# Patient Record
Sex: Female | Born: 1955 | Race: Black or African American | Hispanic: No | Marital: Married | Smoking: Former smoker
Health system: Southern US, Community
[De-identification: ages and names within clinical notes are randomized; demographics above are authoritative.]

## PROBLEM LIST (undated history)

## (undated) DIAGNOSIS — D069 Carcinoma in situ of cervix, unspecified: Secondary | ICD-10-CM

## (undated) DIAGNOSIS — F419 Anxiety disorder, unspecified: Secondary | ICD-10-CM

## (undated) DIAGNOSIS — M1990 Unspecified osteoarthritis, unspecified site: Secondary | ICD-10-CM

## (undated) DIAGNOSIS — R87610 Atypical squamous cells of undetermined significance on cytologic smear of cervix (ASC-US): Secondary | ICD-10-CM

## (undated) HISTORY — PX: ABDOMINAL HYSTERECTOMY: SHX81

## (undated) HISTORY — PX: REDUCTION MAMMAPLASTY: SUR839

## (undated) HISTORY — PX: BUNIONECTOMY: SHX129

## (undated) HISTORY — DX: Carcinoma in situ of cervix, unspecified: D06.9

---
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
## 1998-04-20 ENCOUNTER — Other Ambulatory Visit: Admission: RE | Admit: 1998-04-20 | Discharge: 1998-04-20 | Payer: Self-pay | Admitting: Obstetrics and Gynecology

## 1999-06-07 ENCOUNTER — Other Ambulatory Visit: Admission: RE | Admit: 1999-06-07 | Discharge: 1999-06-07 | Payer: Self-pay | Admitting: Obstetrics and Gynecology

## 2000-08-07 ENCOUNTER — Other Ambulatory Visit: Admission: RE | Admit: 2000-08-07 | Discharge: 2000-08-07 | Payer: Self-pay | Admitting: Obstetrics and Gynecology

## 2001-10-01 ENCOUNTER — Other Ambulatory Visit: Admission: RE | Admit: 2001-10-01 | Discharge: 2001-10-01 | Payer: Self-pay | Admitting: Obstetrics and Gynecology

## 2002-07-10 ENCOUNTER — Encounter: Admission: RE | Admit: 2002-07-10 | Discharge: 2002-07-10 | Payer: Self-pay | Admitting: Obstetrics and Gynecology

## 2002-07-10 ENCOUNTER — Encounter: Payer: Self-pay | Admitting: Obstetrics and Gynecology

## 2002-08-11 ENCOUNTER — Other Ambulatory Visit: Admission: RE | Admit: 2002-08-11 | Discharge: 2002-08-11 | Payer: Self-pay | Admitting: Obstetrics and Gynecology

## 2003-07-13 ENCOUNTER — Encounter: Admission: RE | Admit: 2003-07-13 | Discharge: 2003-07-13 | Payer: Self-pay | Admitting: Obstetrics and Gynecology

## 2003-11-10 ENCOUNTER — Other Ambulatory Visit: Admission: RE | Admit: 2003-11-10 | Discharge: 2003-11-10 | Payer: Self-pay | Admitting: Obstetrics and Gynecology

## 2004-01-03 HISTORY — PX: CERVICAL CONE BIOPSY: SUR198

## 2004-01-14 ENCOUNTER — Encounter (INDEPENDENT_AMBULATORY_CARE_PROVIDER_SITE_OTHER): Payer: Self-pay | Admitting: Specialist

## 2004-01-14 ENCOUNTER — Ambulatory Visit (HOSPITAL_COMMUNITY): Admission: RE | Admit: 2004-01-14 | Discharge: 2004-01-14 | Payer: Self-pay | Admitting: Obstetrics and Gynecology

## 2004-01-14 ENCOUNTER — Ambulatory Visit (HOSPITAL_BASED_OUTPATIENT_CLINIC_OR_DEPARTMENT_OTHER): Admission: RE | Admit: 2004-01-14 | Discharge: 2004-01-14 | Payer: Self-pay | Admitting: Obstetrics and Gynecology

## 2004-05-16 ENCOUNTER — Other Ambulatory Visit: Admission: RE | Admit: 2004-05-16 | Discharge: 2004-05-16 | Payer: Self-pay | Admitting: Obstetrics and Gynecology

## 2004-07-15 ENCOUNTER — Encounter: Admission: RE | Admit: 2004-07-15 | Discharge: 2004-07-15 | Payer: Self-pay | Admitting: Obstetrics and Gynecology

## 2004-08-26 ENCOUNTER — Other Ambulatory Visit: Admission: RE | Admit: 2004-08-26 | Discharge: 2004-08-26 | Payer: Self-pay | Admitting: Obstetrics and Gynecology

## 2005-01-02 DIAGNOSIS — D069 Carcinoma in situ of cervix, unspecified: Secondary | ICD-10-CM

## 2005-01-02 HISTORY — DX: Carcinoma in situ of cervix, unspecified: D06.9

## 2005-01-20 ENCOUNTER — Other Ambulatory Visit: Admission: RE | Admit: 2005-01-20 | Discharge: 2005-01-20 | Payer: Self-pay | Admitting: Obstetrics and Gynecology

## 2005-03-27 ENCOUNTER — Ambulatory Visit (HOSPITAL_COMMUNITY): Admission: RE | Admit: 2005-03-27 | Discharge: 2005-03-27 | Payer: Self-pay | Admitting: Family Medicine

## 2005-04-19 ENCOUNTER — Ambulatory Visit: Payer: Self-pay | Admitting: Internal Medicine

## 2005-04-19 LAB — CONVERTED CEMR LAB
Basophils Absolute: 0 10*3/uL
Basophils Relative: 0 %
Eosinophils Absolute: 0.1 10*3/uL
HCT: 36.7 %
Hemoglobin: 11.8 g/dL
Lymphocytes Relative: 24 %
Lymphs Abs: 1.7 10*3/uL
MCV: 91.8 fL
Neutro Abs: 5 10*3/uL
Sed Rate: 28 mm/hr
WBC: 7.3 10*3/uL

## 2005-04-20 ENCOUNTER — Ambulatory Visit (HOSPITAL_COMMUNITY): Admission: RE | Admit: 2005-04-20 | Discharge: 2005-04-20 | Payer: Self-pay | Admitting: Internal Medicine

## 2005-04-21 ENCOUNTER — Ambulatory Visit: Payer: Self-pay | Admitting: Internal Medicine

## 2005-04-21 ENCOUNTER — Encounter (INDEPENDENT_AMBULATORY_CARE_PROVIDER_SITE_OTHER): Payer: Self-pay | Admitting: Specialist

## 2005-05-12 ENCOUNTER — Ambulatory Visit: Payer: Self-pay | Admitting: Internal Medicine

## 2005-06-02 ENCOUNTER — Ambulatory Visit: Payer: Self-pay | Admitting: Internal Medicine

## 2005-06-02 ENCOUNTER — Ambulatory Visit (HOSPITAL_COMMUNITY): Admission: RE | Admit: 2005-06-02 | Discharge: 2005-06-02 | Payer: Self-pay | Admitting: Internal Medicine

## 2005-06-12 ENCOUNTER — Inpatient Hospital Stay (HOSPITAL_COMMUNITY): Admission: RE | Admit: 2005-06-12 | Discharge: 2005-06-14 | Payer: Self-pay | Admitting: Obstetrics and Gynecology

## 2005-06-12 ENCOUNTER — Encounter (INDEPENDENT_AMBULATORY_CARE_PROVIDER_SITE_OTHER): Payer: Self-pay | Admitting: *Deleted

## 2005-06-23 ENCOUNTER — Ambulatory Visit: Payer: Self-pay | Admitting: Internal Medicine

## 2005-07-27 ENCOUNTER — Encounter: Admission: RE | Admit: 2005-07-27 | Discharge: 2005-07-27 | Payer: Self-pay | Admitting: Obstetrics and Gynecology

## 2006-02-02 LAB — CONVERTED CEMR LAB: Pap Smear: NORMAL

## 2006-02-07 ENCOUNTER — Telehealth (INDEPENDENT_AMBULATORY_CARE_PROVIDER_SITE_OTHER): Payer: Self-pay | Admitting: Internal Medicine

## 2006-02-08 ENCOUNTER — Other Ambulatory Visit: Admission: RE | Admit: 2006-02-08 | Discharge: 2006-02-08 | Payer: Self-pay | Admitting: Obstetrics and Gynecology

## 2006-08-03 ENCOUNTER — Encounter: Admission: RE | Admit: 2006-08-03 | Discharge: 2006-08-03 | Payer: Self-pay | Admitting: Obstetrics and Gynecology

## 2006-08-28 ENCOUNTER — Encounter (INDEPENDENT_AMBULATORY_CARE_PROVIDER_SITE_OTHER): Payer: Self-pay | Admitting: Internal Medicine

## 2006-08-28 DIAGNOSIS — D649 Anemia, unspecified: Secondary | ICD-10-CM

## 2006-08-29 ENCOUNTER — Ambulatory Visit: Payer: Self-pay | Admitting: Internal Medicine

## 2006-08-29 DIAGNOSIS — J309 Allergic rhinitis, unspecified: Secondary | ICD-10-CM | POA: Insufficient documentation

## 2006-08-29 DIAGNOSIS — H109 Unspecified conjunctivitis: Secondary | ICD-10-CM | POA: Insufficient documentation

## 2007-03-14 ENCOUNTER — Other Ambulatory Visit: Admission: RE | Admit: 2007-03-14 | Discharge: 2007-03-14 | Payer: Self-pay | Admitting: Obstetrics and Gynecology

## 2007-04-14 IMAGING — US US EXTREM LOW VENOUS*R*
1 series · 14 of 22 positions shown · non-contrast
Comparison: none

CLINICAL DATA: Right lower extremity swelling.
 RIGHT LOWER EXTREMITY VENOUS DOPPLER ULTRASOUND:
TECHNIQUE: Gray-scale sonography with compression, as well as color and duplex Doppler ultrasound, were performed to evaluate the deep venous system from the level of the common femoral vein through the popliteal and proximal calf veins.

[Series 1: unknown · 14 of 22 slices shown]
[im 1/22]
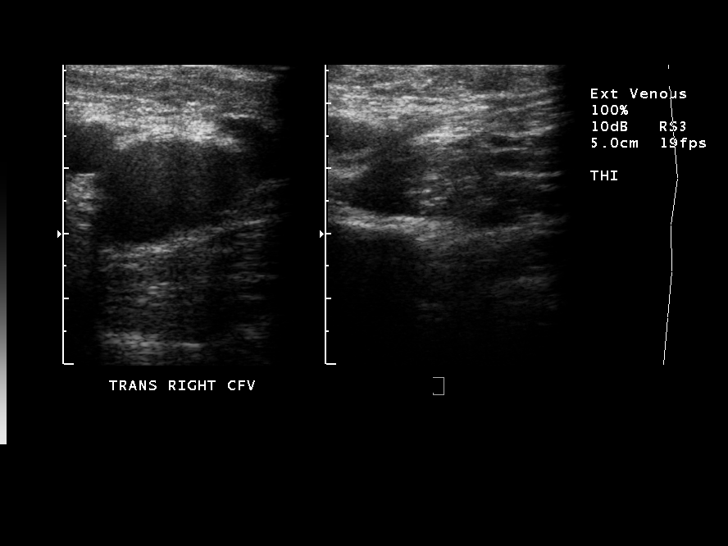
[im 3/22]
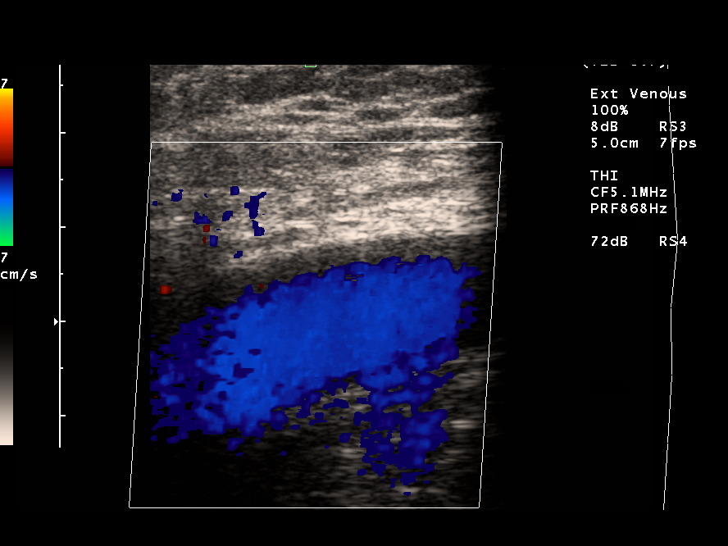
[im 4/22]
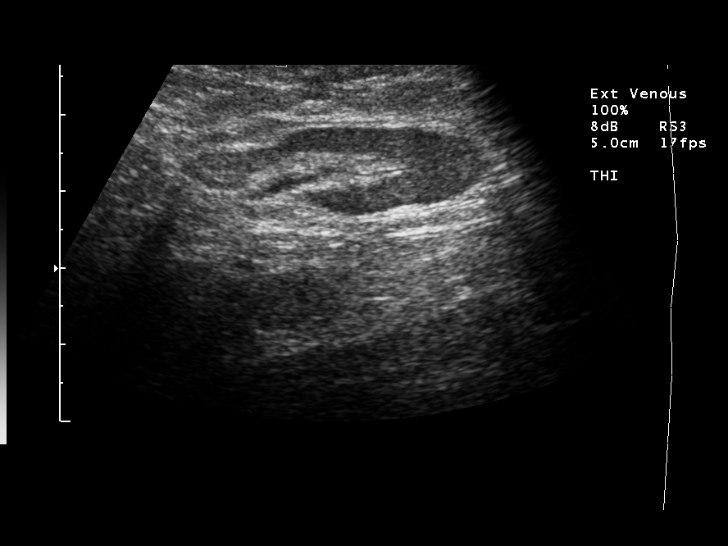
[im 6/22]
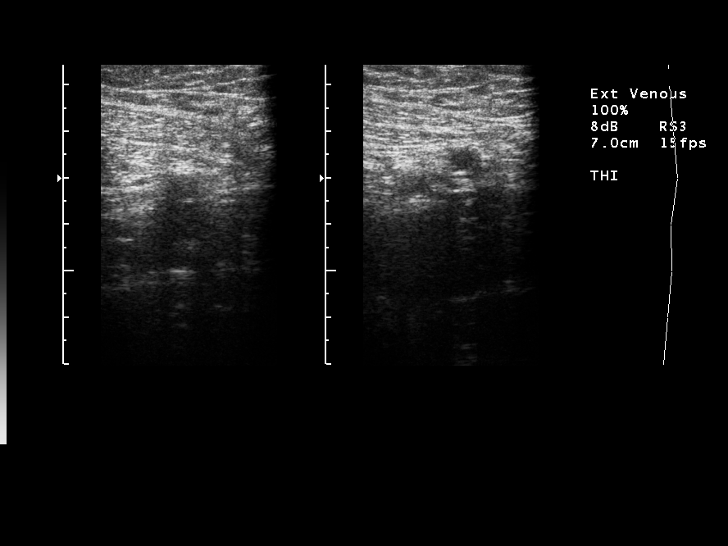
[im 8/22]
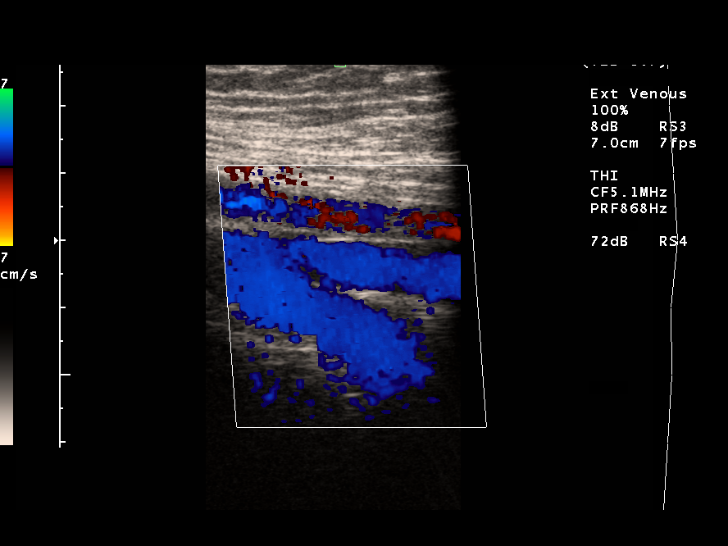
[im 9/22]
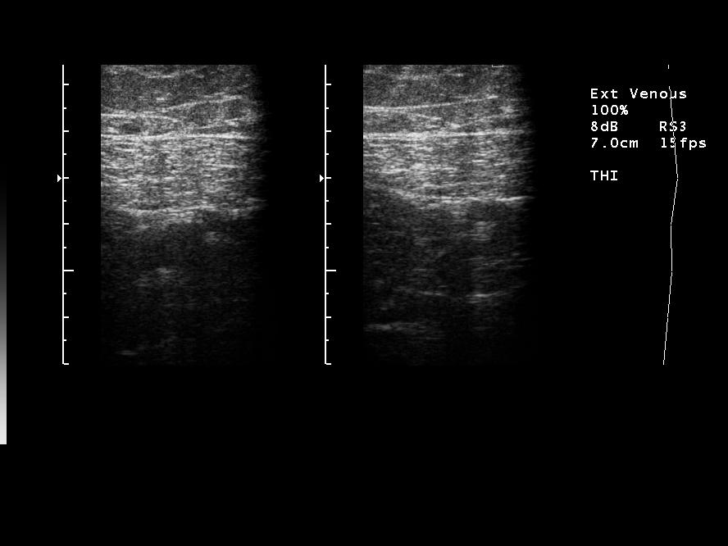
[im 11/22]
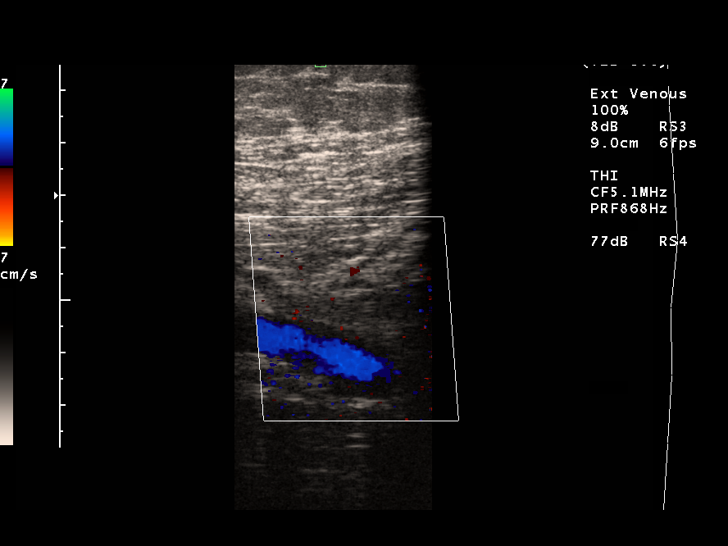
[im 12/22]
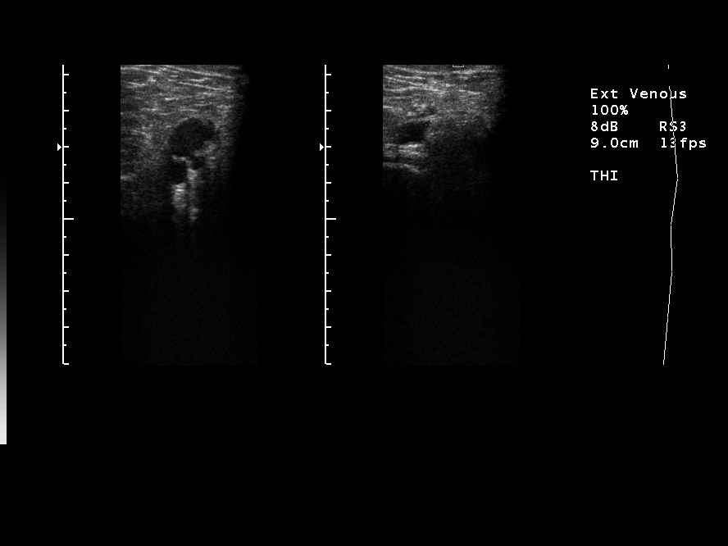
[im 14/22]
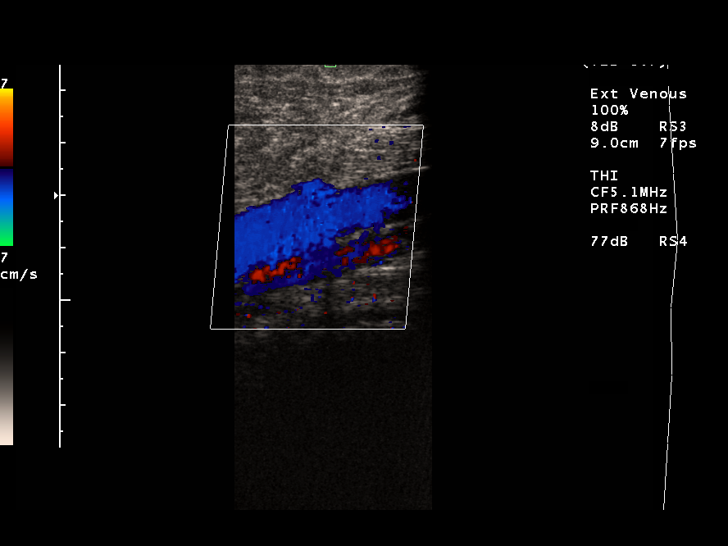
[im 15/22]
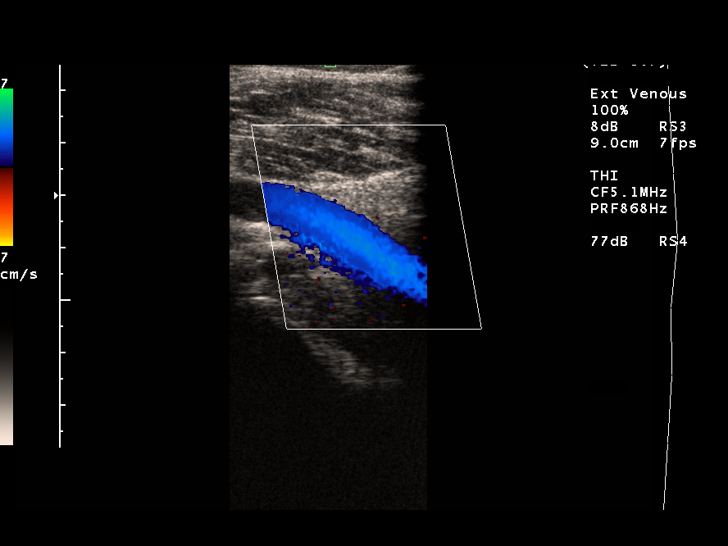
[im 17/22]
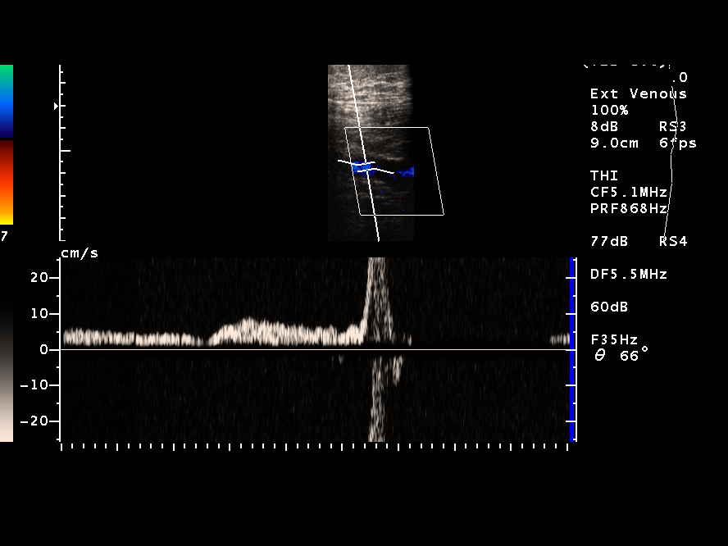
[im 19/22]
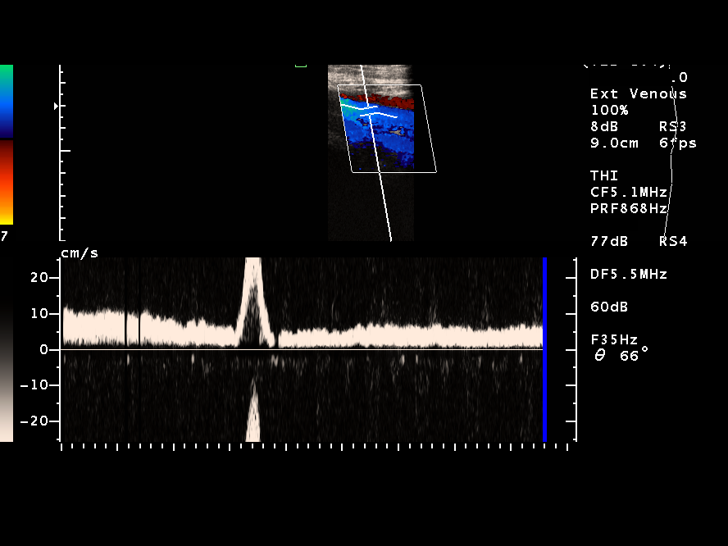
[im 20/22]
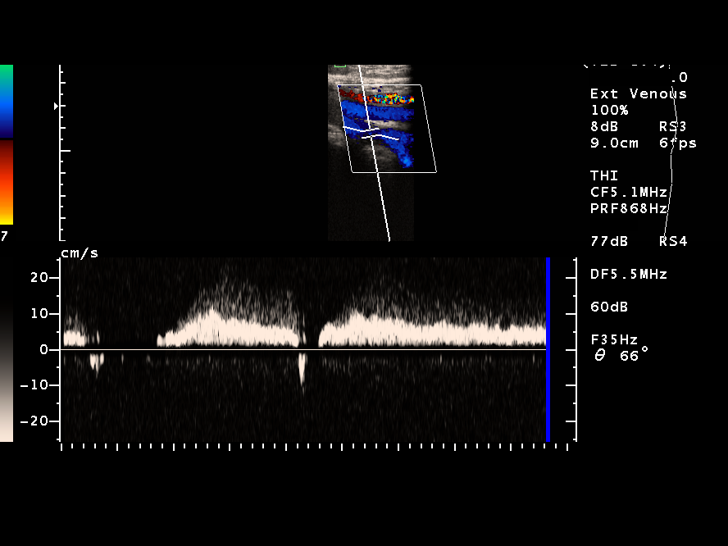
[im 22/22]
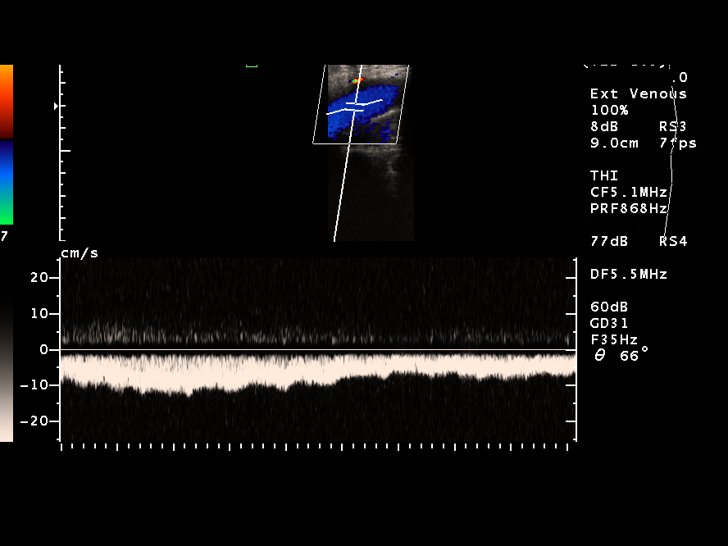

[14 of 22 positions shown; findings below may reference images not displayed]

FINDINGS: Complete compressibility is demonstrated throughout the visualized deep veins.  No venous filling defects are identified by gray-scale or color Doppler sonography.  Doppler waveforms show normal direction of venous flow, with normal phasicity and response to augmentation.
IMPRESSION: Negative.  No evidence of deep venous thrombosis.

## 2007-08-05 ENCOUNTER — Encounter: Admission: RE | Admit: 2007-08-05 | Discharge: 2007-08-05 | Payer: Self-pay | Admitting: Obstetrics and Gynecology

## 2008-03-26 ENCOUNTER — Other Ambulatory Visit: Admission: RE | Admit: 2008-03-26 | Discharge: 2008-03-26 | Payer: Self-pay | Admitting: Obstetrics and Gynecology

## 2008-03-26 ENCOUNTER — Ambulatory Visit: Payer: Self-pay | Admitting: Obstetrics and Gynecology

## 2008-03-26 ENCOUNTER — Encounter: Payer: Self-pay | Admitting: Obstetrics and Gynecology

## 2008-08-05 ENCOUNTER — Encounter: Admission: RE | Admit: 2008-08-05 | Discharge: 2008-08-05 | Payer: Self-pay | Admitting: Obstetrics and Gynecology

## 2009-04-30 ENCOUNTER — Ambulatory Visit: Payer: Self-pay | Admitting: Obstetrics and Gynecology

## 2009-04-30 ENCOUNTER — Other Ambulatory Visit: Admission: RE | Admit: 2009-04-30 | Discharge: 2009-04-30 | Payer: Self-pay | Admitting: Obstetrics and Gynecology

## 2009-07-21 ENCOUNTER — Ambulatory Visit: Payer: Self-pay | Admitting: Obstetrics and Gynecology

## 2009-08-06 ENCOUNTER — Encounter: Admission: RE | Admit: 2009-08-06 | Discharge: 2009-08-06 | Payer: Self-pay | Admitting: Obstetrics and Gynecology

## 2010-05-20 NOTE — Discharge Summary (Signed)
NAMEMANMEET, ARZOLA                ACCOUNT NO.:  [id number]   MEDICAL RECORD NO.:  [id number]          PATIENT TYPE:  INP   LOCATION:  9303                          FACILITY:  [facility]   PHYSICIAN:  Daniel L. 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 On the day of admission, she was taken to the operating  room and a total abdominal hysterectomy was performed. Postoperatively she  continued to progress and by the second postoperative day she was ready for  discharge.   DISCHARGE MEDICATIONS:  Tylox for pain relief, ferrous sulfate for iron  replacement and Dulcolax p.r.n. gas.   She will come back to the office next Monday for staple removal.   The final pathology report is not available at time of dictation.   DISCHARGE DIAGNOSES:  1.  Leiomyomata uteri, symptomatic.  2.  Carcinoma in situ of the cervix.   CONDITION ON DISCHARGE:  Improved.   OPERATION:  Total abdominal hysterectomy.      Daniel L. Eda Paschal, M.D.  Electronically Signed     DLG/MEDQ  D:  06/14/2005  T:  06/14/2005  Job:  [id number]

## 2010-05-20 NOTE — Op Note (Signed)
Tina [name], Tina [name]                ACCOUNT NO.:  [id number]   MEDICAL RECORD NO.:  [id number]          PATIENT TYPE:  INP   LOCATION:  9399                          FACILITY:  [facility]   PHYSICIAN:  Daniel L. Gottsegen, M.D.DATE OF BIRTH:  1955-04-22   DATE OF PROCEDURE:  06/12/2005  DATE OF DISCHARGE:                                 OPERATIVE REPORT   PREOPERATIVE DIAGNOSIS:  Large fibroids with symptoms carcinoma in situ of  the endocervix.   OPERATIONS:  Total abdominal hysterectomy.   SURGEON:  Dr. Eda Paschal   FIRST ASSISTANT:  Dr. Lily Peer.   FINDINGS AT SURGERY:  The patient had an extremely large fibroid uterus that  extended all the way to the umbilicus.  Total weight was 1300 grams when it  was removed.  Ovaries, fallopian tubes and pelvic peritoneum were free of  any disease.   PROCEDURE:  After adequate general endotracheal anesthesia, the patient was  placed in supine position, prepped and draped in the usual sterile manner.  A Foley catheter was inserted into her bladder.  A midline vertical incision  was made from the umbilicus to the suprapubic area.  It was extended down  through the fascia and then to the peritoneum which was entered by sharp  dissection.  There was fairly brisk bleeding opening the layers and this was  controlled with both figure-of-eights with 0 chromic as well as the Bovie.  When the peritoneal cavity was opened the above findings were noted.  The  uterus could be delivered.  The utero-ovarian round ligaments and fallopian  tubes were clamped, cut and doubly suture ligated with #1 chromic catgut.  The vesicouterine fold of peritoneum and posterior peritoneum was taken down  by sharp dissection.  The uterine arteries were clamped, cut and doubly  suture ligated.  The top of the fundus was then amputated and sent to  pathology for tissue diagnosis and then the cervix was removed in the  following fashion.  The parametria was taken down with  successive bites by  clamping, cutting and suture ligating with #1 chromic catgut.  The  cervicovaginal junction was identified with sharp dissection and the cervix  was sent to pathology for tissue diagnosis.  Angle sutures were placed in  the angles of the vagina incorporating the uterosacral and cardinal  ligaments for good vault support and then the cuff was closed with figure-of-  eights of #1 chromic catgut.  Copious irrigation was done with Ringer's  lactate.  Two sponge, needle and instrument counts were correct.  The  peritoneum and the fascia was closed in a single layer a double strand 0 PDS  one was started at the lower part of the incision, one was started at the  top and they met in the midline and were tied in place.  Copious  subcutaneous tissue was irrigated.  Copious irrigation was used to irrigate  the subcutaneous tissue.  A solution of dexamethasone was injected on the  skin edges to try to reduce the risk of keloid and then the skin was closed  with staples.  Estimated blood  loss for the entire procedure was 500 mL with  none replaced.  The patient tolerated the procedure well and left the  operating room in satisfactory condition.     Daniel L. Eda Paschal, M.D.  Electronically Signed    DLG/MEDQ  D:  06/12/2005  T:  06/12/2005  Job:  [id number]

## 2010-05-20 NOTE — H&P (Signed)
NAME:  Tina [name] NO.:  [id number]   MEDICAL RECORD NO.:  [id number]          PATIENT TYPE:  INP   LOCATION:                                FACILITY:  [facility]   PHYSICIAN:  Daniel L. Gottsegen, M.D.DATE OF BIRTH:  1955/10/21   DATE OF ADMISSION:  06/12/2005  DATE OF DISCHARGE:                                HISTORY & PHYSICAL   CHIEF COMPLAINT:  Symptomatic fibroids.   HISTORY OF PRESENT ILLNESS:  The patient is a 55 year old, gravida 1, para  0, AB 1 who has been watched now with gradually enlarging fibroids. Her  uterus now is the size of someone who is [redacted] weeks pregnant. You cannot feel  her ovaries on exam so it is difficult to tell whether she has ovarian  disease. When we ultrasound her, there is no way to image the right ovary as  a result of these large fibroids. The left ovary can be seen and is normal.  On ultrasound, she has multiple very large fibroids that are anywhere from 6  to 5 cm. The patient has severe menorrhagia, it is somewhat controlled with  oral contraceptives but it still is a problem. She is having frequent  urination from pressure on her bladder. She also is having pressure on her  rectum as well. She has also developed edema and stasis in her legs and her  internist feels that some of this is due to pressure on the large veins as a  result of these fibroids. What has made the entire picture even more  difficult in terms of continuing to manage her medically is that she has had  carcinoma in situ of the cervix, her canal was involved. Conization was done  and it is now almost impossible to get good Pap smears high in the cervix  because of the large size of the fibroids and inability to really visualize  her cervix well or negotiate her cervical canal well in order to do Pap  smears. As a result of all the above, she now enters the hospital for total  abdominal hysterectomy. We are going to conserve her ovaries assuming that  they are  normal because of her age. She appreciates the issues of ovarian  cancer down the road but feels that the benefits outweigh the risks as do I.   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   CURRENT MEDICATIONS:  Iron and oral contraceptives.   ALLERGIES:  She is allergic to no drugs.   FAMILY HISTORY:  Father has had coronary artery disease, paternal aunt has  had breast cancer. Mother had stomach cancer. She had a paternal aunt with  colon cancer. Her father is also hypertensive.   SOCIAL HISTORY:  Noncontributory.   PHYSICAL EXAMINATION:  GENERAL:  The patient is a well-developed, well-  nourished, female in no acute distress.  VITAL SIGNS:  Her blood pressure is 136/84, her pulse is 80 and  regular. Her  respirations are 16 and nonlabored. She is afebrile.  HEENT:  All within normal limits.  NECK:  Supple. Trachea in the midline. Thyroid is not enlarged.  LUNGS:  Clear to P&A.  HEART:  No thrills, heaves or murmurs.  BREASTS:  No masses.  ABDOMEN:  Soft without guarding, rebound or masses other than the fundus  which is palpable almost to the umbilicus.  PELVIC:  External is normal. BUS is normal, vaginal is normal. Cervix is  difficult to visualize but grossly appears normal. Last Pap smear was normal  but endocervical cells were not obtained. The uterus is enlarged to 18 week  size by fibroids. Adnexa cannot be palpated because of the fibroids.  Rectovaginal is confirmatory.  EXTREMITIES:  Reveal venous stasis with edema especially on the right side.   IMPRESSION:  Symptomatic fibroids, carcinoma in situ of the cervix.   PLAN:  Total abdominal hysterectomy.      Daniel L. Eda Paschal, M.D.  Electronically Signed     DLG/MEDQ  D:  06/10/2005  T:  06/10/2005  Job:  [id number]

## 2010-05-20 NOTE — Op Note (Signed)
[name], [name]                ACCOUNT NO.:  [id number]   MEDICAL RECORD NO.:  [id number]          PATIENT TYPE:  AMB   LOCATION:  [facility]                         FACILITY:  [facility]   PHYSICIAN:  Daniel L. Gottsegen, M.D.DATE OF BIRTH:  1955/03/06   DATE OF PROCEDURE:  01/14/2004  DATE OF DISCHARGE:                                 OPERATIVE REPORT   PREOPERATIVE DIAGNOSES:  1.  CIN-III of the endocervical canal.  2.  Dysfunctional uterine bleeding.   POSTOPERATIVE DIAGNOSES:  1.  CIN-III of the endocervical canal.  2.  Dysfunctional uterine bleeding.   OPERATION:  Conization of the cervix. Endometrial biopsy.   SURGEON:  Daniel L. Eda Paschal, M.D.   ANESTHESIA:  General anesthesia.   INDICATIONS FOR PROCEDURE:  The patient is a 55 year old female who  presented to the office at the end of last year.  She was on oral  contraceptives but she was having dysfunctional uterine bleeding.  An  endometrial biopsy was done in the office because of dysfunctional uterine  bleeding and a Pap smear was done as part of her yearly examination.  The  endometrial biopsy came back showing mostly squamous epithelium and it did  show high grade dysplasia.  Her Pap smear also showed high grade dysplasia.  As a result of this, she returned for colposcopy with biopsy.  The  ectocervix appeared normal but the endocervical ECC was positive for CIN.  She now enters the hospital for conization of the cervix for the above  because we did not really get endometrial tissue on her previous endometrial  biopsy.  We will also repeat the endometrial biopsy today.   FINDINGS:  External and vaginal was normal. Cervix is somewhat distorted  because of large fibroids.  First it is difficult to even visualize the  cervix because of the large fibroids distorting the angle of the uterus but  once we were able to correct for that, the cervix appears to be somewhat  distorted probable from her multiple fibroids.   Uterus itself is enlarged to  10 to 12-week size by multiple fibroids.  No adnexal masses could be  appreciated.   DESCRIPTION OF PROCEDURE:  After adequate general anesthesia, the patient  was placed in the dorsal lithotomy position, prepped and draped in the usual  sterile manner.  One to 200,000 solution of epinephrine was injected around  the cervix and 0.5% Xylocaine.  Angle sutures were placed in the angles of  the cervicovaginal junction with 0 Vicryl and a sharp knife conization was  done.  There was some trouble getting a typical clean cut because of the  distortion of the cervix, but it was felt that we did get it and especially  got deep enough to get good endocervical tissue.  Following this, the uterus  sounded.  It sounded to 12 to 13 cm consistent with her fibroids and an  endometrial biopsy was done with Pipelle  without difficulty.  A 360 degree  incision McDonald's type suture was placed with 0 Vicryl to control  bleeding.  It did not completely control it so  modified Sturmdorf sutures  were also  placed.  Surgicel was left in the canal.  At the termination of the  procedure there was absolutely no bleeding noted.  Blood loss was  approximately 50 mL with none replaced.  The patient left the operating room  in satisfactory condition.     Dani   DLG/MEDQ  D:  01/14/2004  T:  01/14/2004  Job:  [id number]

## 2010-06-13 ENCOUNTER — Encounter: Payer: Self-pay | Admitting: Obstetrics and Gynecology

## 2010-06-27 ENCOUNTER — Encounter: Payer: Self-pay | Admitting: Obstetrics and Gynecology

## 2010-07-13 ENCOUNTER — Encounter: Payer: Self-pay | Admitting: Obstetrics and Gynecology

## 2010-07-15 ENCOUNTER — Encounter (INDEPENDENT_AMBULATORY_CARE_PROVIDER_SITE_OTHER): Payer: BC Managed Care – PPO | Admitting: Obstetrics and Gynecology

## 2010-07-15 ENCOUNTER — Other Ambulatory Visit: Payer: Self-pay | Admitting: Obstetrics and Gynecology

## 2010-07-15 ENCOUNTER — Other Ambulatory Visit (HOSPITAL_COMMUNITY)
Admission: RE | Admit: 2010-07-15 | Discharge: 2010-07-15 | Disposition: A | Discharge status: Home / Self Care | Payer: BC Managed Care – PPO | Source: Ambulatory Visit | Attending: Obstetrics and Gynecology | Admitting: Obstetrics and Gynecology

## 2010-07-15 DIAGNOSIS — R82998 Other abnormal findings in urine: Secondary | ICD-10-CM

## 2010-07-15 DIAGNOSIS — Z124 Encounter for screening for malignant neoplasm of cervix: Secondary | ICD-10-CM | POA: Insufficient documentation

## 2010-07-15 DIAGNOSIS — R635 Abnormal weight gain: Secondary | ICD-10-CM

## 2010-07-15 DIAGNOSIS — Z13220 Encounter for screening for lipoid disorders: Secondary | ICD-10-CM

## 2010-07-15 DIAGNOSIS — Z01419 Encounter for gynecological examination (general) (routine) without abnormal findings: Secondary | ICD-10-CM

## 2010-08-01 ENCOUNTER — Other Ambulatory Visit: Payer: Self-pay | Admitting: Obstetrics and Gynecology

## 2010-08-01 DIAGNOSIS — Z1231 Encounter for screening mammogram for malignant neoplasm of breast: Secondary | ICD-10-CM

## 2010-08-08 ENCOUNTER — Ambulatory Visit
Admission: RE | Admit: 2010-08-08 | Discharge: 2010-08-08 | Disposition: A | Discharge status: Home / Self Care | Payer: BC Managed Care – PPO | Source: Ambulatory Visit | Attending: Obstetrics and Gynecology | Admitting: Obstetrics and Gynecology

## 2010-08-08 DIAGNOSIS — Z1231 Encounter for screening mammogram for malignant neoplasm of breast: Secondary | ICD-10-CM

## 2010-09-21 ENCOUNTER — Other Ambulatory Visit: Payer: Self-pay

## 2010-09-21 MED ORDER — ALPRAZOLAM 0.25 MG PO TABS: 0.2500 mg | ORAL_TABLET | Freq: Every evening | ORAL | Status: AC | PRN

## 2011-03-24 ENCOUNTER — Telehealth: Payer: Self-pay

## 2011-03-24 DIAGNOSIS — N390 Urinary tract infection, site not specified: Secondary | ICD-10-CM

## 2011-03-24 MED ORDER — CIPROFLOXACIN HCL 250 MG PO TABS: 250.0000 mg | ORAL_TABLET | Freq: Two times a day (BID) | ORAL | Status: AC

## 2011-03-24 MED ORDER — NITROFURANTOIN MONOHYD MACRO 100 MG PO CAPS: 100.0000 mg | ORAL_CAPSULE | Freq: Two times a day (BID) | ORAL | Status: DC

## 2011-03-24 NOTE — Telephone Encounter (Signed)
PT. NOTIFIED OF G'S NOTE BELOW BY WORK VOICEMAIL & ORDERS IN [facility]

## 2011-03-24 NOTE — Telephone Encounter (Signed)
Macrobid twice a day with food for 7 days. Patient should come in for urinalysis after treatment.

## 2011-03-24 NOTE — Telephone Encounter (Signed)
UNABLE TO LEAVE WORK BUT IS REQUESTING ANTIBIOTIC FOR FREQUENCY AND BURNING WITH URINATION & CLOUDY URINE.

## 2011-03-24 NOTE — Telephone Encounter (Signed)
Cipro 250mg twice a day for 7 days.

## 2011-03-24 NOTE — Telephone Encounter (Signed)
Pharmacy called back and said she was allergic to Macrobid.  I called patient and she confirmed she had a rash that came out before with Macrobid.  I added it to her allergies now needs new rx sent in

## 2011-03-24 NOTE — Telephone Encounter (Signed)
Addended by: Valeda Malm L on: 03/24/2011 04:33 PM   Modules accepted: Orders

## 2011-03-27 NOTE — Telephone Encounter (Signed)
New rx was sent in and patient was informed.

## 2011-07-05 ENCOUNTER — Other Ambulatory Visit: Payer: Self-pay | Admitting: Obstetrics and Gynecology

## 2011-07-05 DIAGNOSIS — Z1231 Encounter for screening mammogram for malignant neoplasm of breast: Secondary | ICD-10-CM

## 2011-08-01 ENCOUNTER — Encounter: Payer: Self-pay | Admitting: Gynecology

## 2011-08-01 DIAGNOSIS — D219 Benign neoplasm of connective and other soft tissue, unspecified: Secondary | ICD-10-CM | POA: Insufficient documentation

## 2011-08-01 DIAGNOSIS — D069 Carcinoma in situ of cervix, unspecified: Secondary | ICD-10-CM | POA: Insufficient documentation

## 2011-08-09 ENCOUNTER — Ambulatory Visit (INDEPENDENT_AMBULATORY_CARE_PROVIDER_SITE_OTHER): Payer: BC Managed Care – PPO | Admitting: Obstetrics and Gynecology

## 2011-08-09 ENCOUNTER — Encounter: Payer: Self-pay | Admitting: Obstetrics and Gynecology

## 2011-08-09 ENCOUNTER — Ambulatory Visit
Admission: RE | Admit: 2011-08-09 | Discharge: 2011-08-09 | Disposition: A | Discharge status: Home / Self Care | Payer: BC Managed Care – PPO | Source: Ambulatory Visit | Attending: Obstetrics and Gynecology | Admitting: Obstetrics and Gynecology

## 2011-08-09 VITALS — BP 124/80 | Ht 65.0 in | Wt 200.0 lb

## 2011-08-09 DIAGNOSIS — Z01419 Encounter for gynecological examination (general) (routine) without abnormal findings: Secondary | ICD-10-CM

## 2011-08-09 DIAGNOSIS — Z1231 Encounter for screening mammogram for malignant neoplasm of breast: Secondary | ICD-10-CM

## 2011-08-09 DIAGNOSIS — E7800 Pure hypercholesterolemia, unspecified: Secondary | ICD-10-CM

## 2011-08-09 DIAGNOSIS — N390 Urinary tract infection, site not specified: Secondary | ICD-10-CM

## 2011-08-09 LAB — CBC WITH DIFFERENTIAL/PLATELET
Basophils Absolute: 0.1 10*3/uL (ref 0.0–0.1)
Basophils Relative: 1 % (ref 0–1)
Eosinophils Absolute: 0.1 10*3/uL (ref 0.0–0.7)
HCT: 35.6 % — ABNORMAL LOW (ref 36.0–46.0)
Hemoglobin: 12 g/dL (ref 12.0–15.0)
Lymphocytes Relative: 41 % (ref 12–46)
MCHC: 33.7 g/dL (ref 30.0–36.0)
Monocytes Absolute: 0.5 10*3/uL (ref 0.1–1.0)
Neutrophils Relative %: 49 % (ref 43–77)
Platelets: 264 10*3/uL (ref 150–400)
RDW: 14.4 % (ref 11.5–15.5)

## 2011-08-09 LAB — LIPID PANEL: Cholesterol: 221 mg/dL — ABNORMAL HIGH (ref 0–200)

## 2011-08-09 NOTE — Patient Instructions (Signed)
Continue yearly mammograms 

## 2011-08-09 NOTE — Progress Notes (Signed)
Patient came to see me today for her annual GYN exam. She does get extremely hot at night but is not consistent. She is not yet ready to consider HRT. She had her mammogram today. She had her last bone density in 2011 and was normal. She is scheduling a colonoscopy. In 2007 she had a total abdominal hysterectomy for CIN-3 with endocervical canal involvement and large fibroids. She has had normal Pap smears since then. Her last Pap smear was 2012. Last year her cholesterol was slightly elevated. Her total was 228 with an LDL of 139. She is having no vaginal bleeding. She is having no pelvic pain.  HEENT: Within normal limits. Tina [name] present. Neck: No masses. Supraclavicular lymph nodes: Not enlarged. Breasts: Examined in both sitting and lying position. Symmetrical without skin changes or masses. Abdomen: Soft no masses guarding or rebound. No hernias. Pelvic: External within normal limits. BUS within normal limits. Vaginal examination shows good estrogen effect, no cystocele enterocele or rectocele. Cervix and uterus absent. Adnexa within normal limits. Rectovaginal confirmatory. Extremities within normal limits.  Assessment: Menopausal symptoms. Elevated cholesterol. CIN-3. Plan: She will let me know if she would like HRT. Continue yearly mammograms. Lipid profile checked. Urine checked. We have treated her for UTI and she never had a followup urine.The new Pap smear guidelines were discussed with the patient. No Pap done.

## 2011-08-10 ENCOUNTER — Other Ambulatory Visit: Payer: Self-pay | Admitting: Obstetrics and Gynecology

## 2011-08-10 ENCOUNTER — Ambulatory Visit (INDEPENDENT_AMBULATORY_CARE_PROVIDER_SITE_OTHER): Payer: BC Managed Care – PPO | Admitting: Urgent Care

## 2011-08-10 ENCOUNTER — Encounter: Payer: Self-pay | Admitting: Urgent Care

## 2011-08-10 ENCOUNTER — Telehealth: Payer: Self-pay | Admitting: Obstetrics and Gynecology

## 2011-08-10 VITALS — BP 133/81 | HR 68 | Temp 98.2°F | Ht 65.0 in | Wt 200.0 lb

## 2011-08-10 DIAGNOSIS — Z1211 Encounter for screening for malignant neoplasm of colon: Secondary | ICD-10-CM

## 2011-08-10 DIAGNOSIS — K5900 Constipation, unspecified: Secondary | ICD-10-CM

## 2011-08-10 DIAGNOSIS — E7800 Pure hypercholesterolemia, unspecified: Secondary | ICD-10-CM

## 2011-08-10 LAB — URINALYSIS W MICROSCOPIC + REFLEX CULTURE
Crystals: NONE SEEN
Glucose, UA: NEGATIVE mg/dL
Leukocytes, UA: NEGATIVE
Nitrite: NEGATIVE
Protein, ur: NEGATIVE mg/dL
Squamous Epithelial / LPF: NONE SEEN

## 2011-08-10 MED ORDER — ALPRAZOLAM 0.25 MG PO TABS: 0.2500 mg | ORAL_TABLET | Freq: Four times a day (QID) | ORAL | Status: DC

## 2011-08-10 MED ORDER — PEG-KCL-NACL-NASULF-NA ASC-C 100 G PO SOLR: 1.0000 | ORAL | Status: DC

## 2011-08-10 NOTE — Telephone Encounter (Signed)
Rx e-scribed to pharmacy. 

## 2011-08-10 NOTE — Patient Instructions (Addendum)
Use over-the-counter MiraLax 17 g daily as needed for constipation Continue your cardio 5-6 days per week. Add weightlifting 3 days per week to help lose weight. Colonoscopy with Dr. Darrick Penna

## 2011-08-10 NOTE — Telephone Encounter (Signed)
Message copied by Keenan Bachelor on Thu Aug 10, 2011  9:36 AM ------      Message from: Trellis Paganini      Created: Thu Aug 10, 2011  6:00 AM       Tell pt cholesterol still too high. She needs to work on low fat, low cholesterol diet and return fasted for lipid panel in 4 months. If not improving may need medication.

## 2011-08-10 NOTE — Telephone Encounter (Signed)
Xanax 0.25 mg #30 with one refill.

## 2011-08-10 NOTE — Assessment & Plan Note (Signed)
Tina [name] is a pleasant 56 y.o. female due for average risk screening colonoscopy.  I have discussed risks & benefits which include, but are not limited to, bleeding, infection, perforation & drug reaction.  The patient agrees with this plan & written consent will be obtained.

## 2011-08-10 NOTE — Telephone Encounter (Signed)
I called patient and discussed results of lipid panel and changes she can make. I mailed her Cholesterol Control Diet handout as well as a copy of her lipid panel results per patient request.  I put recall and lab order in for her to return in 4 mos.  Patient asked during this conversation about getting medication.  She said a few years ago Dr. Reece Agar prescribed her generic Xanax and she just uses it occasionally "when she feels nervous".  It ran out awhile back and she would like to an RX to have on hand.

## 2011-08-10 NOTE — Addendum Note (Signed)
Addended by: Trellis Paganini on: 08/10/2011 12:34 PM   Modules accepted: Orders

## 2011-08-10 NOTE — Assessment & Plan Note (Signed)
Chronic constipation Miralax 17 grams daily as needed

## 2011-08-10 NOTE — Progress Notes (Signed)
Faxed to PCP

## 2011-08-10 NOTE — Progress Notes (Signed)
Referring Provider: Dr Gottsegen Primary Care Physician:  GOTTSEGEN,DANIEL L, MD Primary Gastroenterologist:  Dr. Sandi Fields  Chief Complaint  Patient presents with  . Colonoscopy    HPI:  Tina [name] is a 56 y.o. female here as a referral from Dr. Gottsegen for screening colonoscopy.  She has had mild constipation, but otherwise denies any problems.   Denies diarrhea, rectal bleeding, melena, anorexia or weight loss.  Rare heartburn.   Denies nausea, vomiting, dysphagia, odynophagia or anorexia. Past Medical History  Diagnosis Date  . Fibroid   . CIN III (cervical intraepithelial neoplasia III)     Past Surgical History  Procedure Date  . Cervical cone biopsy 2006  . Partial hysterectomy 2007    TAH    Current Outpatient Prescriptions  Medication Sig Dispense Refill  . ALPRAZolam (XANAX) 0.25 MG tablet Take 1 tablet (0.25 mg total) by mouth every 6 (six) hours.  30 tablet  1  . Cetirizine HCl (ZYRTEC PO) Take by mouth.      . glucosamine-chondroitin 500-400 MG tablet Take 1 tablet by mouth daily.      . peg 3350 powder (MOVIPREP) 100 G SOLR Take 1 kit (100 g total) by mouth as directed.  1 kit  0  . Pseudoephedrine-Guaifenesin (MUCINEX D PO) Take by mouth.        Allergies as of 08/10/2011 - Review Complete 08/10/2011  Allergen Reaction Noted  . Neomycin  08/01/2011  . Ciprofloxacin Rash 08/01/2011  . Macrobid (nitrofurantoin monohydrate macrocrystals) Rash 03/24/2011    Family History  Problem Relation Age of Onset  . Stomach cancer Mother   . Hypertension Father   . Heart disease Father   . Prostate cancer Father   . Colon cancer Paternal Aunt   . Breast cancer Paternal Aunt     Age 81  . Prostate cancer Brother     History   Social History  . Marital Status: Married    Spouse Name: N/A    Number of Children: N/A  . 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 Smoking status: Never Smoker    . Smokeless tobacco: Not on file  . Alcohol Use: 1.5 oz/week    3 drink(s) per week  . Drug Use: No  . Sexually Active: Yes    Birth Control/ Protection: Surgical   Other Topics Concern  . Not on file   Social History Narrative  . No narrative on file    Review of Systems: Gen: Denies any fever, chills, sweats, anorexia, fatigue, weakness, malaise, weight loss, and sleep disorder CV: Denies chest pain, angina, palpitations, syncope, orthopnea, PND, peripheral edema, and claudication. Resp: Denies dyspnea at rest, dyspnea with exercise, cough, sputum, wheezing, coughing up blood, and pleurisy. GI: Denies vomiting blood, jaundice, and fecal incontinence.  GU : Denies urinary burning, blood in urine, urinary frequency, urinary hesitancy, nocturnal urination, and urinary incontinence. MS: Denies joint pain, limitation of movement, and swelling, stiffness, low back pain, extremity pain. Denies muscle weakness, cramps, atrophy.  Derm: Denies rash, itching, dry skin, hives, moles, warts, or unhealing ulcers.  Psych: Denies depression, anxiety, memory loss, suicidal ideation, hallucinations, paranoia, and confusion. Heme: Denies bruising, bleeding, and enlarged lymph nodes. Neuro:  Denies any headaches, dizziness, paresthesias. Endo:  Denies any problems with DM, thyroid, adrenal function.  Physical Exam: BP 133/81  Pulse 68  Temp 98.2 F (36.8 C) (Temporal)  Ht 5' 5" (  1.651 m)  Wt 200 lb (90.719 kg)  BMI 33.28 kg/m2 No LMP recorded. Patient has had a hysterectomy. General:   Alert,  Well-developed, well-nourished, pleasant and cooperative in NAD Head:  Normocephalic and atraumatic. Eyes:  Sclera clear, no icterus.   Conjunctiva pink. Ears:  Normal auditory acuity. Nose:  No deformity, discharge, or lesions. Mouth:  No deformity or lesions,oropharynx pink & moist. Neck:  Supple; no masses or thyromegaly. Lungs:  Clear throughout to auscultation.   No wheezes, crackles, or  rhonchi. No acute distress. Heart:  Regular rate and rhythm; no murmurs, clicks, rubs,  or gallops. Abdomen:  Normal bowel sounds.  No bruits.  Soft, non-tender and non-distended without masses, hepatosplenomegaly or hernias noted.  No guarding or rebound tenderness.   Rectal:  Deferred.  Msk:  Symmetrical without gross deformities. Normal posture. Pulses:  Normal pulses noted. Extremities:  No clubbing or edema. Neurologic:  Alert and oriented x4;  grossly normal neurologically. Skin:  Intact without significant lesions or rashes. Lymph Nodes:  No significant cervical adenopathy. Psych:  Alert and cooperative. Normal mood and affect.  

## 2011-08-11 ENCOUNTER — Encounter (HOSPITAL_COMMUNITY): Payer: Self-pay | Admitting: Pharmacy Technician

## 2011-08-16 MED ORDER — SODIUM CHLORIDE 0.9 % IV SOLN
INTRAVENOUS | Status: DC
  Administered 2011-08-17: 07:00:00 via INTRAVENOUS

## 2011-08-17 ENCOUNTER — Ambulatory Visit (HOSPITAL_COMMUNITY)
Admission: RE | Admit: 2011-08-17 | Discharge: 2011-08-17 | Disposition: A | Discharge status: Home / Self Care | Payer: BC Managed Care – PPO | Source: Ambulatory Visit | Attending: Internal Medicine | Admitting: Internal Medicine

## 2011-08-17 ENCOUNTER — Encounter (HOSPITAL_COMMUNITY): Payer: Self-pay

## 2011-08-17 ENCOUNTER — Encounter (HOSPITAL_COMMUNITY)
Admission: RE | Disposition: A | Discharge status: Home / Self Care | Payer: Self-pay | Source: Ambulatory Visit | Attending: Internal Medicine

## 2011-08-17 DIAGNOSIS — D126 Benign neoplasm of colon, unspecified: Secondary | ICD-10-CM

## 2011-08-17 DIAGNOSIS — Z1211 Encounter for screening for malignant neoplasm of colon: Secondary | ICD-10-CM

## 2011-08-17 DIAGNOSIS — K5730 Diverticulosis of large intestine without perforation or abscess without bleeding: Secondary | ICD-10-CM | POA: Insufficient documentation

## 2011-08-17 HISTORY — PX: COLONOSCOPY: SHX5424

## 2011-08-17 HISTORY — DX: Anxiety disorder, unspecified: F41.9

## 2011-08-17 HISTORY — DX: Unspecified osteoarthritis, unspecified site: M19.90

## 2011-08-17 SURGERY — COLONOSCOPY
Anesthesia: Moderate Sedation

## 2011-08-17 MED ORDER — MEPERIDINE HCL 100 MG/ML IJ SOLN
INTRAMUSCULAR | Status: DC | PRN
  Administered 2011-08-17: 25 mg via INTRAVENOUS
  Administered 2011-08-17: 50 mg via INTRAVENOUS

## 2011-08-17 MED ORDER — MEPERIDINE HCL 100 MG/ML IJ SOLN
INTRAMUSCULAR | Status: AC
  Filled 2011-08-17: qty 1

## 2011-08-17 MED ORDER — STERILE WATER FOR IRRIGATION IR SOLN
Status: DC | PRN
  Administered 2011-08-17: 07:00:00

## 2011-08-17 MED ORDER — MIDAZOLAM HCL 5 MG/5ML IJ SOLN
INTRAMUSCULAR | Status: DC | PRN
  Administered 2011-08-17 (×2): 2 mg via INTRAVENOUS

## 2011-08-17 MED ORDER — MIDAZOLAM HCL 5 MG/5ML IJ SOLN
INTRAMUSCULAR | Status: AC
  Filled 2011-08-17: qty 5

## 2011-08-17 NOTE — Interval H&P Note (Signed)
History and Physical Interval Note:  08/17/2011 8:01 AM  Tina [name]  has presented today for surgery, with the diagnosis of SCREENING  The various methods of treatment have been discussed with the patient and family. After consideration of risks, benefits and other options for treatment, the patient has consented to  Procedure(s) (LRB): COLONOSCOPY (N/A) as a surgical intervention .  The patient's history has been reviewed, patient examined, no change in status, stable for surgery.  I have reviewed the patient's chart and labs.  Questions were answered to the patient's satisfaction.     [name]

## 2011-08-17 NOTE — Op Note (Signed)
[facility] [address] [place], [place]  [address]  COLONOSCOPY PROCEDURE REPORT  PATIENT:  Tina [name], Tina [name]  MR#:  [id number] BIRTHDATE:  [birth year], 55 yrs. old  GENDER:  female ENDOSCOPIST:  R. Roetta Sessions, MD FACP [facility] REF. BY:           Dr. Silvio Clayman PROCEDURE DATE:  08/17/2011 PROCEDURE:  Colonoscopy with snare polypectomy  INDICATIONS:  First-ever average risk screening colonoscopy.  INFORMED CONSENT:  The risks, benefits, alternatives and imponderables including but not limited to bleeding, perforation as well as the possibility of a missed lesion have been reviewed. The potential for biopsy, lesion removal, etc. have also been discussed.  Questions have been answered.  All parties agreeable. Please see the history and physical in the medical record for more information.  MEDICATIONS:  Versed 4 mg IV and Demerol 75 mg IV in divided doses.  DESCRIPTION OF PROCEDURE:  After a digital rectal exam was performed, the EC-[id number] ([id number]) colonoscope was advanced from the anus through the rectum and colon to the area of the cecum, ileocecal valve and appendiceal orifice.  The cecum was deeply intubated.  These structures were well-seen and photographed for the record.  From the level of the cecum and ileocecal valve, the scope was slowly and cautiously withdrawn.  The mucosal surfaces were carefully surveyed utilizing scope tip deflection to facilitate fold flattening as needed.  The scope was pulled down into the rectum where a thorough examination including retroflexion was performed. <<PROCEDUREIMAGES>>  FINDINGS:  Good preparation. Normal rectum. Few scattered pancolonic diverticula; single 5 mm polyp on a fold at the splenic flexure; remainder of colonic mucosa appeared normal.  THERAPEUTIC / DIAGNOSTIC MANEUVERS PERFORMED:    Splenic flexure polyp hot snare removed (recovery in process at the time of this dictation)  COMPLICATIONS:  None  CECAL  WITHDRAWAL TIME:    11 minutes  IMPRESSION:      Few  pancolonic diverticulosis. Colonic polyp-removed as described above.  RECOMMENDATIONS:  Followup on pathology.  ______________________________ R. Roetta Sessions, MD Caleen Essex  CC:  n. eSIGNED:   R. Roetta Sessions at 08/17/2011 08:37 AM  Florencia Reasons, [phone]

## 2011-08-17 NOTE — H&P (View-Only) (Signed)
Referring Provider: Dr Eda Paschal Primary Care Physician:  Trellis Paganini, MD Primary Gastroenterologist:  Dr. Jonette Eva  Chief Complaint  Patient presents with  . Colonoscopy    HPI:  Tina [name] is a 56 y.o. female here as a referral from Dr. Eda Paschal for screening colonoscopy.  She has had mild constipation, but otherwise denies any problems.   Denies diarrhea, rectal bleeding, melena, anorexia or weight loss.  Rare heartburn.   Denies nausea, vomiting, dysphagia, odynophagia or anorexia. Past Medical History  Diagnosis Date  . Fibroid   . CIN III (cervical intraepithelial neoplasia III)     Past Surgical History  Procedure Date  . Cervical cone biopsy 2006  . Partial hysterectomy 2007    TAH    Current Outpatient Prescriptions  Medication Sig Dispense Refill  . ALPRAZolam (XANAX) 0.25 MG tablet Take 1 tablet (0.25 mg total) by mouth every 6 (six) hours.  30 tablet  1  . Cetirizine HCl (ZYRTEC PO) Take by mouth.      Marland Kitchen glucosamine-chondroitin 500-400 MG tablet Take 1 tablet by mouth daily.      . peg 3350 powder (MOVIPREP) 100 G SOLR Take 1 kit (100 g total) by mouth as directed.  1 kit  0  . Pseudoephedrine-Guaifenesin (MUCINEX D PO) Take by mouth.        Allergies as of 08/10/2011 - Review Complete 08/10/2011  Allergen Reaction Noted  . Neomycin  08/01/2011  . Ciprofloxacin Rash 08/01/2011  . Macrobid (nitrofurantoin monohydrate macrocrystals) Rash 03/24/2011    Family History  Problem Relation Age of Onset  . Stomach cancer Mother   . Hypertension Father   . Heart disease Father   . Prostate cancer Father   . Colon cancer Paternal Aunt   . Breast cancer Paternal Aunt     Age 81  . Prostate cancer Brother     History   Social History  . Marital Status: Married    Spouse Name: N/A    Number of Children: N/A  . 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 Smoking status: Never Smoker    . Smokeless tobacco: Not on file  . Alcohol Use: 1.5 oz/week    3 drink(s) per week  . Drug Use: No  . Sexually Active: Yes    Birth Control/ Protection: Surgical   Other Topics Concern  . Not on file   Social History Narrative  . No narrative on file    Review of Systems: Gen: Denies any fever, chills, sweats, anorexia, fatigue, weakness, malaise, weight loss, and sleep disorder CV: Denies chest pain, angina, palpitations, syncope, orthopnea, PND, peripheral edema, and claudication. Resp: Denies dyspnea at rest, dyspnea with exercise, cough, sputum, wheezing, coughing up blood, and pleurisy. GI: Denies vomiting blood, jaundice, and fecal incontinence.  GU : Denies urinary burning, blood in urine, urinary frequency, urinary hesitancy, nocturnal urination, and urinary incontinence. MS: Denies joint pain, limitation of movement, and swelling, stiffness, low back pain, extremity pain. Denies muscle weakness, cramps, atrophy.  Derm: Denies rash, itching, dry skin, hives, moles, warts, or unhealing ulcers.  Psych: Denies depression, anxiety, memory loss, suicidal ideation, hallucinations, paranoia, and confusion. Heme: Denies bruising, bleeding, and enlarged lymph nodes. Neuro:  Denies any headaches, dizziness, paresthesias. Endo:  Denies any problems with DM, thyroid, adrenal function.  Physical Exam: BP 133/81  Pulse 68  Temp 98.2 F (36.8 C) (Temporal)  Ht 5\' 5"  (  1.651 m)  Wt 200 lb (90.719 kg)  BMI 33.28 kg/m2 No LMP recorded. Patient has had a hysterectomy. General:   Alert,  Well-developed, well-nourished, pleasant and cooperative in NAD Head:  Normocephalic and atraumatic. Eyes:  Sclera clear, no icterus.   Conjunctiva pink. Ears:  Normal auditory acuity. Nose:  No deformity, discharge, or lesions. Mouth:  No deformity or lesions,oropharynx pink & moist. Neck:  Supple; no masses or thyromegaly. Lungs:  Clear throughout to auscultation.   No wheezes, crackles, or  rhonchi. No acute distress. Heart:  Regular rate and rhythm; no murmurs, clicks, rubs,  or gallops. Abdomen:  Normal bowel sounds.  No bruits.  Soft, non-tender and non-distended without masses, hepatosplenomegaly or hernias noted.  No guarding or rebound tenderness.   Rectal:  Deferred.  Msk:  Symmetrical without gross deformities. Normal posture. Pulses:  Normal pulses noted. Extremities:  No clubbing or edema. Neurologic:  Alert and oriented x4;  grossly normal neurologically. Skin:  Intact without significant lesions or rashes. Lymph Nodes:  No significant cervical adenopathy. Psych:  Alert and cooperative. Normal mood and affect.

## 2011-08-20 ENCOUNTER — Encounter: Payer: Self-pay | Admitting: Internal Medicine

## 2011-08-22 ENCOUNTER — Encounter (HOSPITAL_COMMUNITY): Payer: Self-pay | Admitting: Internal Medicine

## 2011-10-05 NOTE — Progress Notes (Signed)
REVIEWED.  

## 2012-04-09 ENCOUNTER — Encounter (HOSPITAL_COMMUNITY): Payer: Self-pay | Admitting: *Deleted

## 2012-04-09 ENCOUNTER — Emergency Department (HOSPITAL_COMMUNITY)
Admission: EM | Admit: 2012-04-09 | Discharge: 2012-04-09 | Disposition: A | Discharge status: Home / Self Care | Payer: BC Managed Care – PPO | Attending: Emergency Medicine | Admitting: Emergency Medicine

## 2012-04-09 DIAGNOSIS — T161XXA Foreign body in right ear, initial encounter: Secondary | ICD-10-CM

## 2012-04-09 DIAGNOSIS — Z8739 Personal history of other diseases of the musculoskeletal system and connective tissue: Secondary | ICD-10-CM | POA: Insufficient documentation

## 2012-04-09 DIAGNOSIS — Z8659 Personal history of other mental and behavioral disorders: Secondary | ICD-10-CM | POA: Insufficient documentation

## 2012-04-09 DIAGNOSIS — IMO0002 Reserved for concepts with insufficient information to code with codable children: Secondary | ICD-10-CM | POA: Insufficient documentation

## 2012-04-09 DIAGNOSIS — Z8742 Personal history of other diseases of the female genital tract: Secondary | ICD-10-CM | POA: Insufficient documentation

## 2012-04-09 DIAGNOSIS — T169XXA Foreign body in ear, unspecified ear, initial encounter: Secondary | ICD-10-CM | POA: Insufficient documentation

## 2012-04-09 DIAGNOSIS — Y9389 Activity, other specified: Secondary | ICD-10-CM | POA: Insufficient documentation

## 2012-04-09 DIAGNOSIS — Y929 Unspecified place or not applicable: Secondary | ICD-10-CM | POA: Insufficient documentation

## 2012-04-09 NOTE — ED Notes (Signed)
Part of  Q tip came off in rt ear.

## 2012-04-10 NOTE — ED Provider Notes (Signed)
History     CSN: [id number]  Arrival date & time 04/09/12  2121   First MD Initiated Contact with Patient 04/09/12 2207      No chief complaint on file.   (Consider location/radiation/quality/duration/timing/severity/associated sxs/prior treatment) HPI Comments: Tina [name] is a 57 y.o. Female presenting with a cotton tip from an ear swab in her right ear which occurred just prior to arrival this evening.  She describes that she had itching inside of her ear canal is using a cotton tip to scratch.  She denies pain or decreased hearing acuity and has had no drainage from the ear.  She has found no alleviators for this problem, although her husband tried to remove the cotton without success.  She has no other complaints at this time.     The history is provided by the patient.    Past Medical History  Diagnosis Date   Fibroid    CIN III (cervical intraepithelial neoplasia III)    Anxiety    Arthritis     Past Surgical History  Procedure Laterality Date   Cervical cone biopsy  2006   Partial hysterectomy  2007    TAH   Colonoscopy  08/17/2011    Procedure: COLONOSCOPY;  Surgeon: Corbin Ade, MD;  Location: [facility];  Service: Endoscopy;  Laterality: N/A;  7:30    Family History  Problem Relation Age of Onset   Stomach cancer Mother    Hypertension Father    Heart disease Father    Prostate cancer Father    Colon cancer Paternal Aunt    Breast cancer Paternal Aunt     Age 58   Prostate cancer Brother     History  Substance Use Topics   Smoking status: Never Smoker    Smokeless tobacco: Not on file   Alcohol Use: 1.5 oz/week    3 drink(s) per week    OB History   Grav Para Term Preterm Abortions TAB SAB Ect Mult Living   1    1     0      Review of Systems  HENT: Negative for hearing loss, ear pain, tinnitus and ear discharge.   Skin: Negative.   Neurological: Negative for dizziness, light-headedness and headaches.  All other  systems reviewed and are negative.    Allergies  Shellfish allergy; Ciprofloxacin; Macrobid; and Neomycin  Home Medications   Current Outpatient Rx  Name  Route  Sig  Dispense  Refill   cetirizine (ZYRTEC) 10 MG tablet   Oral   Take 10 mg by mouth daily.          hydroxypropyl methylcellulose (ISOPTO TEARS) 2.5 % ophthalmic solution   Both Eyes   Place 1 drop into both eyes 3 (three) times daily as needed. For dry eyes          naproxen sodium (ANAPROX) 220 MG tablet   Oral   Take 220 mg by mouth every 8 (eight) hours as needed. For pain          Nutritional Supplements (JOINT FORMULA PO)   Oral   Take 1,500 mg by mouth daily. JOINT JUICE:You can forget those hard to digest pills and pour a cool glass of Joint [phone] supplement drink with a full day's supply of 1,500 mg of glucosamine plus chondroitin, Vitamin C and Vitamin D to help keep joints healthy and happy. It comes in two great flavors and has just 25 calories per bottle. Joint Juice supplement is  available nationally in 6-packs and 30-packs  Choose a Flavor:  Cranberry-Pomegranate Blueberry-Acai Natural Citrus Extra Strength   Only available at retail locations   CONTAINS NO JUICE  Supplement Facts Serving Size 1 bottle (237 ml) Servings per Bottle: 1     Amount per Serving % Daily Value*         Calories 20    Total Carbohydrate 4g    Sugars 1g 1%*  Vitamin C (as ascorbic acid) 20 mg 33%  Vitamin D3 (as cholecalciferol) 400 IU 100%  Sodium 135mg  6%*  Potassium 25mg  1%*     Glucosamine HCl 1500mg   Chondroitin Sulfate 1200mg   Hyal-Joint [Hyaluronic Acid (sodium hyaluronate), collagen, and glucosaminoglycan complex] 5mg      * Percent Daily Values based on a 2,000 calorie diet.  No Daily Value established     Other Ingredients: Filtered Water, Sugar, Contains 0.5% or less of the following: Natural Flavor, Citric Acid, Gum Acacia and Xanthan Gum, Beta Carotene (for color), Potassium  Sorbate (to retain freshness), Stevia (Rebaudioside A).    CONTAINS 1% JUICE  Cranberry Pomegranate Supplement Facts Serving Size 1 bottle (8 fl oz)     Amount Per Serving % Daily Value*  Calories 25    Total Carbohydrate 4g Sugars 2g 1%*  Protein <1g    Vitamin C (as ascorbic acid) 20mg  33%  Vitamin D3 (as cholecalciferol) 400 IU 100%  Sodium 30mg  1%*  Potassium 25mg  1%*  Glucosamine HCI  1500mg   Chondroitin Sulfate  200mg    Other Ingredients: Filtered Water, Contains 1% or less of the following: Cranberry Juice Concentrate, Pomegranate Juice Concentrate, Natural Flavor, Sugar, Purple Carrot extract (for color), Malic Acid, Citric Acid, Potassium Sorbate (to retain freshness), Sucralose, Acesulfame Potassium.           BP 132/88   Pulse 72   Temp(Src) 97.8 F (36.6 C) (Oral)   Resp 20   Ht 5\' 6"  (1.676 m)   Wt 200 lb (90.719 kg)   BMI 32.3 kg/m2   SpO2 100%  Physical Exam  Constitutional: She is oriented to person, place, and time. She appears well-developed and well-nourished.  HENT:  Head: Normocephalic and atraumatic.  Right Ear: Tympanic membrane normal. A foreign body is present.  Nose: Mucosal edema and rhinorrhea present.  Mouth/Throat: Uvula is midline, oropharynx is clear and moist and mucous membranes are normal. No oropharyngeal exudate, posterior oropharyngeal edema, posterior oropharyngeal erythema or tonsillar abscesses.  Eyes: Conjunctivae are normal.  Cardiovascular: Normal rate.   Pulmonary/Chest: Effort normal.  Neurological: She is alert and oriented to person, place, and time.  Skin: Skin is warm and dry.  Psychiatric: She has a normal mood and affect.    ED Course  FOREIGN BODY REMOVAL Date/Time: 04/10/2012 12:32 AM Performed by: Burgess Amor Authorized by: Burgess Amor Consent: Verbal consent obtained. Risks and benefits: risks, benefits and alternatives were discussed Consent given by: patient Patient identity confirmed: verbally with  patient Time out: Immediately prior to procedure a "time out" was called to verify the correct patient, procedure, equipment, support staff and site/side marked as required. Body area: ear Location details: right ear Localization method: visualized Removal mechanism: alligator forceps Complexity: simple 1 objects recovered. Objects recovered: cotton swab Post-procedure assessment: foreign body removed Patient tolerance: Patient tolerated the procedure well with no immediate complications.   (including critical care time)  Labs Reviewed - No data to display No results found.   1. Ear foreign body, right, initial encounter  MDM  Reexam of the ear canal and TM revealing normal exam with no trauma from the foreign body or removal process.  When necessary followup anticipated.  Encouraged patient should not use Q-tips in her ears.        Burgess Amor, PA-C 04/10/12 [phone]

## 2012-04-12 NOTE — ED Provider Notes (Signed)
Medical screening examination/treatment/procedure(s) were performed by non-physician practitioner and as supervising physician I was immediately available for consultation/collaboration.   Joya Gaskins, MD 04/12/12 0730

## 2012-06-27 ENCOUNTER — Encounter: Payer: Self-pay | Admitting: Women's Health

## 2012-06-27 ENCOUNTER — Ambulatory Visit (INDEPENDENT_AMBULATORY_CARE_PROVIDER_SITE_OTHER): Payer: BC Managed Care – PPO | Admitting: Women's Health

## 2012-06-27 VITALS — Wt 212.0 lb

## 2012-06-27 DIAGNOSIS — R350 Frequency of micturition: Secondary | ICD-10-CM

## 2012-06-27 DIAGNOSIS — N898 Other specified noninflammatory disorders of vagina: Secondary | ICD-10-CM

## 2012-06-27 DIAGNOSIS — N9489 Other specified conditions associated with female genital organs and menstrual cycle: Secondary | ICD-10-CM

## 2012-06-27 LAB — URINALYSIS W MICROSCOPIC + REFLEX CULTURE
Bilirubin Urine: NEGATIVE
Glucose, UA: NEGATIVE mg/dL
Nitrite: NEGATIVE
pH: 7 (ref 5.0–8.0)

## 2012-06-27 LAB — WET PREP FOR TRICH, YEAST, CLUE: Trich, Wet Prep: NONE SEEN

## 2012-06-27 NOTE — Progress Notes (Signed)
Patient ID: Tina [name], female   DOB: 12-26-1955, 57 y.o.   MRN: [id number] Presents with urinary frequency with urgency. Denies pain or burning with urination.  Scant white discharge,  vaginal dryness. TAH. Recently retired Chartered loss adjuster, concerned with weight. Reports eating snacks/treats at work and eating out frequently. Has been drinking more crystal light energy drinks, several per day for past several weeks.   Exam: Appears well, UA: Negative Abdomen soft nontender, external genitalia within normal limits, speculum exam scant discharge no odor or erythema noted, mild atrophy. Wet prep negative. Bimanual no adnexal fullness or tenderness.  Frequency most likely related to caffeinated beverages  Plan: Reviewed decreasing caffeinated beverages, plain water. Vaginal lubricants with intercourse. Encouraged Weight Watchers, decreasing calories and increasing exercise for weight loss.

## 2012-07-02 ENCOUNTER — Other Ambulatory Visit: Payer: Self-pay

## 2012-07-02 DIAGNOSIS — Z1231 Encounter for screening mammogram for malignant neoplasm of breast: Secondary | ICD-10-CM

## 2012-08-12 ENCOUNTER — Ambulatory Visit
Admission: RE | Admit: 2012-08-12 | Discharge: 2012-08-12 | Disposition: A | Discharge status: Home / Self Care | Payer: BC Managed Care – PPO | Source: Ambulatory Visit

## 2012-08-12 ENCOUNTER — Encounter: Payer: Self-pay | Admitting: Gynecology

## 2012-08-12 DIAGNOSIS — Z1231 Encounter for screening mammogram for malignant neoplasm of breast: Secondary | ICD-10-CM

## 2012-09-20 ENCOUNTER — Encounter: Payer: Self-pay | Admitting: Gynecology

## 2012-11-01 ENCOUNTER — Ambulatory Visit (INDEPENDENT_AMBULATORY_CARE_PROVIDER_SITE_OTHER): Payer: BC Managed Care – PPO | Admitting: Gynecology

## 2012-11-01 ENCOUNTER — Encounter: Payer: Self-pay | Admitting: Gynecology

## 2012-11-01 VITALS — BP 124/78 | Ht 67.0 in | Wt 207.0 lb

## 2012-11-01 DIAGNOSIS — Z01419 Encounter for gynecological examination (general) (routine) without abnormal findings: Secondary | ICD-10-CM

## 2012-11-01 LAB — CBC WITH DIFFERENTIAL/PLATELET
Eosinophils Absolute: 0.1 10*3/uL (ref 0.0–0.7)
Eosinophils Relative: 1 % (ref 0–5)
HCT: 38.6 % (ref 36.0–46.0)
Hemoglobin: 12.9 g/dL (ref 12.0–15.0)
Lymphocytes Relative: 35 % (ref 12–46)
Lymphs Abs: 2.9 10*3/uL (ref 0.7–4.0)
MCH: 29.9 pg (ref 26.0–34.0)
MCV: 89.6 fL (ref 78.0–100.0)
Monocytes Absolute: 0.5 10*3/uL (ref 0.1–1.0)
Monocytes Relative: 6 % (ref 3–12)
RBC: 4.31 MIL/uL (ref 3.87–5.11)

## 2012-11-01 NOTE — Patient Instructions (Signed)
Follow up in one year for annual exam 

## 2012-11-01 NOTE — Progress Notes (Signed)
Tina [name] 1955/11/25 [id number]        57 y.o.  G1P0010 for annual exam.  Former patient of Dr. Eda Paschal. Several issues noted below.  Past medical history,surgical history, problem list, medications, allergies, family history and social history were all reviewed and documented in the EPIC chart.  ROS:  Performed and pertinent positives and negatives are included in the history, assessment and plan .  Exam: Kim assistant Filed Vitals:   11/01/12 1505  BP: 124/78  Height: 5\' 7"  (1.702 m)  Weight: 207 lb (93.895 kg)   General appearance  Normal Skin grossly normal Head/Neck normal with no cervical or supraclavicular adenopathy thyroid normal Lungs  clear Cardiac RR, without RMG Abdominal  soft, nontender, without masses, organomegaly or hernia Breasts  examined lying and sitting without masses, retractions, discharge or axillary adenopathy. Pelvic  Ext/BUS/vagina  normal  Adnexa  Without masses or tenderness    Anus and perineum  normal   Rectovaginal  normal sphincter tone without palpated masses or tenderness.    Assessment/Plan:  57 y.o. G64P0010 female for annual exam.   1. Postmenopausal status post TAH 2007. Doing well without significant symptoms of hot flushes night sweats vaginal dryness dyspareunia. We'll continue to monitor. 2. History CIN-3, cone biopsy 2006 subsequent TAH 2007 with pathology showing CIN-3. Pap smears have all been negative since then. Last Pap smear 2012. No Pap smear done today. Repeat Pap smear next year at 3 year interval. Need to continue screening for least 20 years reviewed with her her current screening guidelines. 3. Mammography 08/2012. Continue with annual mammography. SBE monthly reviewed. 4. DEXA 2011 normal. Recommend repeat at age 89. Increase calcium vitamin D reviewed. 5. Colonoscopy 2013. Repeat at their recommended interval. 6. Health maintenance. Baseline CBC comprehensive metabolic panel lipid profile urinalysis TSH vitamin D  ordered. Followup one year, sooner as needed.  Note: This document was prepared with digital dictation and possible smart phrase technology. Any transcriptional errors that result from this process are unintentional.   Dara Lords MD, 3:49 PM 11/01/2012

## 2012-11-02 LAB — COMPREHENSIVE METABOLIC PANEL
CO2: 21 mEq/L (ref 19–32)
Calcium: 9.4 mg/dL (ref 8.4–10.5)
Chloride: 102 mEq/L (ref 96–112)
Creat: 0.89 mg/dL (ref 0.50–1.10)
Glucose, Bld: 91 mg/dL (ref 70–99)
Total Bilirubin: 0.8 mg/dL (ref 0.3–1.2)

## 2012-11-02 LAB — URINALYSIS W MICROSCOPIC + REFLEX CULTURE
Bacteria, UA: NONE SEEN
Bilirubin Urine: NEGATIVE
Crystals: NONE SEEN
Glucose, UA: NEGATIVE mg/dL
Ketones, ur: NEGATIVE mg/dL
Specific Gravity, Urine: 1.014 (ref 1.005–1.030)
Squamous Epithelial / LPF: NONE SEEN
Urobilinogen, UA: 0.2 mg/dL (ref 0.0–1.0)

## 2012-11-02 LAB — LIPID PANEL
Cholesterol: 231 mg/dL — ABNORMAL HIGH (ref 0–200)
Total CHOL/HDL Ratio: 3 Ratio
Triglycerides: 82 mg/dL (ref <150)
VLDL: 16 mg/dL (ref 0–40)

## 2012-11-04 ENCOUNTER — Telehealth: Payer: Self-pay | Admitting: *Deleted

## 2012-11-04 ENCOUNTER — Other Ambulatory Visit: Payer: Self-pay | Admitting: Gynecology

## 2012-11-04 DIAGNOSIS — E7800 Pure hypercholesterolemia, unspecified: Secondary | ICD-10-CM

## 2012-11-04 MED ORDER — ALPRAZOLAM 0.25 MG PO TABS: 0.2500 mg | ORAL_TABLET | Freq: Every evening | ORAL | Status: DC | PRN

## 2012-11-04 NOTE — Telephone Encounter (Signed)
rx called in, unable to tell pt because voicemail not set up.

## 2012-11-04 NOTE — Telephone Encounter (Signed)
If she has not done this yet I would try OTC melatonin first to see if that doesn't help. Some patients find this very helpful.

## 2012-11-04 NOTE — Telephone Encounter (Signed)
Pt informed with the below note, pt asked if you would refill her xanax 0.25 mg. Last given in 2013 she just uses it occasionally "when she feels nervous". Please advise

## 2012-11-04 NOTE — Telephone Encounter (Signed)
Okay for Xanax 0.25 mg #30, 1 refill 1 by mouth every 6 hours as needed for anxiety

## 2012-11-04 NOTE — Telephone Encounter (Signed)
Pt was seen for annual on 11/01/12 said she spoke with you about having problem with sleeping at night, pt asked if she could have Rx to help? Please advise

## 2012-11-04 NOTE — Telephone Encounter (Signed)
Pt informed rx sent.

## 2012-11-06 ENCOUNTER — Encounter: Payer: Self-pay | Admitting: Obstetrics and Gynecology

## 2013-01-02 HISTORY — PX: BREAST SURGERY: SHX581

## 2013-02-11 ENCOUNTER — Ambulatory Visit (INDEPENDENT_AMBULATORY_CARE_PROVIDER_SITE_OTHER): Payer: BC Managed Care – PPO | Admitting: Gynecology

## 2013-02-11 ENCOUNTER — Encounter: Payer: Self-pay | Admitting: Gynecology

## 2013-02-11 VITALS — BP 120/78 | Wt 207.0 lb

## 2013-02-11 DIAGNOSIS — N6489 Other specified disorders of breast: Secondary | ICD-10-CM

## 2013-02-11 NOTE — Patient Instructions (Signed)
Followup for your annual exam this coming fall when do.

## 2013-02-11 NOTE — Progress Notes (Signed)
Patient presents to discuss options with her large pendulous breasts. Patient is having worsening shoulder pain from her bra straps and upper back pain associated with her large breasts. Is trying to lose weight and get involved in an exercise program but finds also that the size of her breasts makes this more cumbersome. Has not noticed any abnormalities such as breast masses, nipple discharge or other changes.  Exam with Maudie Mercury assistant Shoulders examined with bilateral indentations from her bra straps. Upper spine straight without overt abnormalities. Both breasts examined lying and sitting noted to be large and pendulous without masses, retractions, discharge or adenopathy.  Assessment and plan: Large pendulous breasts increasingly becoming uncomfortable to the patient to include shoulder and back pain as well as obstructive in her attempts at daily activities and exercise. Recommend that patient consider breast reduction surgery and she will discuss this with a plastic surgeon. Very little else to offer her other than supportive bras. Patient will followup in the fall when she is due for her annual exam.

## 2013-03-31 ENCOUNTER — Ambulatory Visit (HOSPITAL_BASED_OUTPATIENT_CLINIC_OR_DEPARTMENT_OTHER): Admit: 2013-03-31 | Payer: BC Managed Care – PPO | Admitting: Specialist

## 2013-03-31 ENCOUNTER — Encounter (HOSPITAL_BASED_OUTPATIENT_CLINIC_OR_DEPARTMENT_OTHER): Payer: Self-pay

## 2013-03-31 SURGERY — MAMMOPLASTY, REDUCTION
Anesthesia: General | Site: Breast | Laterality: Bilateral

## 2013-05-14 ENCOUNTER — Telehealth: Payer: Self-pay | Admitting: *Deleted

## 2013-05-14 MED ORDER — ALPRAZOLAM 0.25 MG PO TABS: 0.2500 mg | ORAL_TABLET | Freq: Every evening | ORAL | Status: DC | PRN

## 2013-05-14 NOTE — Telephone Encounter (Signed)
Pt calling requesting refill on xanax 0.25 mg last filled in 2014. Please advise

## 2013-05-14 NOTE — Telephone Encounter (Signed)
rx called in, pt informed. 

## 2013-05-14 NOTE — Telephone Encounter (Signed)
Xanax 0.25 mg #30 with 2 refills one by mouth when necessary insomnia

## 2013-08-13 ENCOUNTER — Other Ambulatory Visit: Payer: Self-pay

## 2013-08-13 DIAGNOSIS — Z1231 Encounter for screening mammogram for malignant neoplasm of breast: Secondary | ICD-10-CM

## 2013-08-22 ENCOUNTER — Ambulatory Visit: Payer: BC Managed Care – PPO

## 2013-09-04 ENCOUNTER — Ambulatory Visit
Admission: RE | Admit: 2013-09-04 | Discharge: 2013-09-04 | Disposition: A | Discharge status: Home / Self Care | Payer: BC Managed Care – PPO | Source: Ambulatory Visit

## 2013-09-04 ENCOUNTER — Encounter (INDEPENDENT_AMBULATORY_CARE_PROVIDER_SITE_OTHER): Payer: Self-pay

## 2013-09-04 DIAGNOSIS — Z1231 Encounter for screening mammogram for malignant neoplasm of breast: Secondary | ICD-10-CM

## 2013-10-01 ENCOUNTER — Telehealth: Payer: Self-pay | Admitting: *Deleted

## 2013-10-01 NOTE — Telephone Encounter (Signed)
Pt called requesting Lipid profile results from 2014 and 2013, result mailed

## 2013-11-03 ENCOUNTER — Encounter: Payer: Self-pay | Admitting: Gynecology

## 2013-11-07 ENCOUNTER — Ambulatory Visit (INDEPENDENT_AMBULATORY_CARE_PROVIDER_SITE_OTHER): Payer: BC Managed Care – PPO | Admitting: Gynecology

## 2013-11-07 ENCOUNTER — Other Ambulatory Visit (HOSPITAL_COMMUNITY)
Admission: RE | Admit: 2013-11-07 | Discharge: 2013-11-07 | Disposition: A | Discharge status: Home / Self Care | Payer: BC Managed Care – PPO | Source: Ambulatory Visit | Attending: Gynecology | Admitting: Gynecology

## 2013-11-07 ENCOUNTER — Encounter: Payer: Self-pay | Admitting: Gynecology

## 2013-11-07 VITALS — BP 120/74 | Ht 66.0 in | Wt 203.0 lb

## 2013-11-07 DIAGNOSIS — Z01419 Encounter for gynecological examination (general) (routine) without abnormal findings: Secondary | ICD-10-CM

## 2013-11-07 LAB — COMPREHENSIVE METABOLIC PANEL
ALT: 16 U/L (ref 0–35)
AST: 19 U/L (ref 0–37)
Albumin: 4.1 g/dL (ref 3.5–5.2)
Alkaline Phosphatase: 98 U/L (ref 39–117)
BUN: 13 mg/dL (ref 6–23)
CHLORIDE: 101 meq/L (ref 96–112)
CO2: 21 mEq/L (ref 19–32)
CREATININE: 0.81 mg/dL (ref 0.50–1.10)
Calcium: 9.1 mg/dL (ref 8.4–10.5)
Glucose, Bld: 97 mg/dL (ref 70–99)
Potassium: 4.4 mEq/L (ref 3.5–5.3)
Sodium: 137 mEq/L (ref 135–145)
Total Bilirubin: 1.1 mg/dL (ref 0.2–1.2)
Total Protein: 7.1 g/dL (ref 6.0–8.3)

## 2013-11-07 LAB — LIPID PANEL
CHOL/HDL RATIO: 3.1 ratio
CHOLESTEROL: 219 mg/dL — AB (ref 0–200)
HDL: 71 mg/dL (ref >39)
LDL Cholesterol: 131 mg/dL — ABNORMAL HIGH (ref 0–99)
Triglycerides: 86 mg/dL (ref <150)
VLDL: 17 mg/dL (ref 0–40)

## 2013-11-07 LAB — CBC WITH DIFFERENTIAL/PLATELET
Basophils Absolute: 0 10*3/uL (ref 0.0–0.1)
Basophils Relative: 0 % (ref 0–1)
EOS PCT: 2 % (ref 0–5)
Eosinophils Absolute: 0.1 10*3/uL (ref 0.0–0.7)
HEMATOCRIT: 36.9 % (ref 36.0–46.0)
Hemoglobin: 12.2 g/dL (ref 12.0–15.0)
LYMPHS PCT: 35 % (ref 12–46)
Lymphs Abs: 2.4 10*3/uL (ref 0.7–4.0)
MCH: 29.8 pg (ref 26.0–34.0)
MCHC: 33.1 g/dL (ref 30.0–36.0)
MCV: 90.2 fL (ref 78.0–100.0)
MONO ABS: 0.6 10*3/uL (ref 0.1–1.0)
MONOS PCT: 8 % (ref 3–12)
Neutro Abs: 3.8 10*3/uL (ref 1.7–7.7)
Neutrophils Relative %: 55 % (ref 43–77)
Platelets: 262 10*3/uL (ref 150–400)
RBC: 4.09 MIL/uL (ref 3.87–5.11)
RDW: 14.5 % (ref 11.5–15.5)
WBC: 6.9 10*3/uL (ref 4.0–10.5)

## 2013-11-07 LAB — TSH: TSH: 0.91 u[IU]/mL (ref 0.350–4.500)

## 2013-11-07 NOTE — Addendum Note (Signed)
Addended by: Nelva Nay on: 11/07/2013 11:30 AM   Modules accepted: Orders, SmartSet

## 2013-11-07 NOTE — Patient Instructions (Signed)
You may obtain a copy of any labs that were done today by logging onto MyChart as outlined in the instructions provided with your AVS (after visit summary). The office will not call with normal lab results but certainly if there are any significant abnormalities then we will contact you.   Health Maintenance, Female A healthy lifestyle and preventative care can promote health and wellness.  Maintain regular health, dental, and eye exams.  Eat a healthy diet. Foods like vegetables, fruits, whole grains, low-fat dairy products, and lean protein foods contain the nutrients you need without too many calories. Decrease your intake of foods high in solid fats, added sugars, and salt. Get information about a proper diet from your caregiver, if necessary.  Regular physical exercise is one of the most important things you can do for your health. Most adults should get at least 150 minutes of moderate-intensity exercise (any activity that increases your heart rate and causes you to sweat) each week. In addition, most adults need muscle-strengthening exercises on 2 or more days a week.   Maintain a healthy weight. The body mass index (BMI) is a screening tool to identify possible weight problems. It provides an estimate of body fat based on height and weight. Your caregiver can help determine your BMI, and can help you achieve or maintain a healthy weight. For adults 20 years and older:  A BMI below 18.5 is considered underweight.  A BMI of 18.5 to 24.9 is normal.  A BMI of 25 to 29.9 is considered overweight.  A BMI of 30 and above is considered obese.  Maintain normal blood lipids and cholesterol by exercising and minimizing your intake of saturated fat. Eat a balanced diet with plenty of fruits and vegetables. Blood tests for lipids and cholesterol should begin at age 61 and be repeated every 5 years. If your lipid or cholesterol levels are high, you are over 50, or you are a high risk for heart  disease, you may need your cholesterol levels checked more frequently.Ongoing high lipid and cholesterol levels should be treated with medicines if diet and exercise are not effective.  If you smoke, find out from your caregiver how to quit. If you do not use tobacco, do not start.  Lung cancer screening is recommended for adults aged 33 80 years who are at high risk for developing lung cancer because of a history of smoking. Yearly low-dose computed tomography (CT) is recommended for people who have at least a 30-pack-year history of smoking and are a current smoker or have quit within the past 15 years. A pack year of smoking is smoking an average of 1 pack of cigarettes a day for 1 year (for example: 1 pack a day for 30 years or 2 packs a day for 15 years). Yearly screening should continue until the smoker has stopped smoking for at least 15 years. Yearly screening should also be stopped for people who develop a health problem that would prevent them from having lung cancer treatment.  If you are pregnant, do not drink alcohol. If you are breastfeeding, be very cautious about drinking alcohol. If you are not pregnant and choose to drink alcohol, do not exceed 1 drink per day. One drink is considered to be 12 ounces (355 mL) of beer, 5 ounces (148 mL) of wine, or 1.5 ounces (44 mL) of liquor.  Avoid use of street drugs. Do not share needles with anyone. Ask for help if you need support or instructions about stopping  the use of drugs.  High blood pressure causes heart disease and increases the risk of stroke. Blood pressure should be checked at least every 1 to 2 years. Ongoing high blood pressure should be treated with medicines, if weight loss and exercise are not effective.  If you are 59 to 57 years old, ask your caregiver if you should take aspirin to prevent strokes.  Diabetes screening involves taking a blood sample to check your fasting blood sugar level. This should be done once every 3  years, after age 91, if you are within normal weight and without risk factors for diabetes. Testing should be considered at a younger age or be carried out more frequently if you are overweight and have at least 1 risk factor for diabetes.  Breast cancer screening is essential preventative care for women. You should practice "breast self-awareness." This means understanding the normal appearance and feel of your breasts and may include breast self-examination. Any changes detected, no matter how small, should be reported to a caregiver. Women in their 66s and 30s should have a clinical breast exam (CBE) by a caregiver as part of a regular health exam every 1 to 3 years. After age 101, women should have a CBE every year. Starting at age 100, women should consider having a mammogram (breast X-ray) every year. Women who have a family history of breast cancer should talk to their caregiver about genetic screening. Women at a high risk of breast cancer should talk to their caregiver about having an MRI and a mammogram every year.  Breast cancer gene (BRCA)-related cancer risk assessment is recommended for women who have family members with BRCA-related cancers. BRCA-related cancers include breast, ovarian, tubal, and peritoneal cancers. Having family members with these cancers may be associated with an increased risk for harmful changes (mutations) in the breast cancer genes BRCA1 and BRCA2. Results of the assessment will determine the need for genetic counseling and BRCA1 and BRCA2 testing.  The Pap test is a screening test for cervical cancer. Women should have a Pap test starting at age 57. Between ages 25 and 35, Pap tests should be repeated every 2 years. Beginning at age 37, you should have a Pap test every 3 years as long as the past 3 Pap tests have been normal. If you had a hysterectomy for a problem that was not cancer or a condition that could lead to cancer, then you no longer need Pap tests. If you are  between ages 50 and 76, and you have had normal Pap tests going back 10 years, you no longer need Pap tests. If you have had past treatment for cervical cancer or a condition that could lead to cancer, you need Pap tests and screening for cancer for at least 20 years after your treatment. If Pap tests have been discontinued, risk factors (such as a new sexual partner) need to be reassessed to determine if screening should be resumed. Some women have medical problems that increase the chance of getting cervical cancer. In these cases, your caregiver may recommend more frequent screening and Pap tests.  The human papillomavirus (HPV) test is an additional test that may be used for cervical cancer screening. The HPV test looks for the virus that can cause the cell changes on the cervix. The cells collected during the Pap test can be tested for HPV. The HPV test could be used to screen women aged 44 years and older, and should be used in women of any age  who have unclear Pap test results. After the age of 55, women should have HPV testing at the same frequency as a Pap test.  Colorectal cancer can be detected and often prevented. Most routine colorectal cancer screening begins at the age of 44 and continues through age 20. However, your caregiver may recommend screening at an earlier age if you have risk factors for colon cancer. On a yearly basis, your caregiver may provide home test kits to check for hidden blood in the stool. Use of a small camera at the end of a tube, to directly examine the colon (sigmoidoscopy or colonoscopy), can detect the earliest forms of colorectal cancer. Talk to your caregiver about this at age 86, when routine screening begins. Direct examination of the colon should be repeated every 5 to 10 years through age 13, unless early forms of pre-cancerous polyps or small growths are found.  Hepatitis C blood testing is recommended for all people born from [birth year] through 1964 and any  individual with known risks for hepatitis C.  Practice safe sex. Use condoms and avoid high-risk sexual practices to reduce the spread of sexually transmitted infections (STIs). Sexually active women aged 36 and younger should be checked for Chlamydia, which is a common sexually transmitted infection. Older women with new or multiple partners should also be tested for Chlamydia. Testing for other STIs is recommended if you are sexually active and at increased risk.  Osteoporosis is a disease in which the bones lose minerals and strength with aging. This can result in serious bone fractures. The risk of osteoporosis can be identified using a bone density scan. Women ages 20 and over and women at risk for fractures or osteoporosis should discuss screening with their caregivers. Ask your caregiver whether you should be taking a calcium supplement or vitamin D to reduce the rate of osteoporosis.  Menopause can be associated with physical symptoms and risks. Hormone replacement therapy is available to decrease symptoms and risks. You should talk to your caregiver about whether hormone replacement therapy is right for you.  Use sunscreen. Apply sunscreen liberally and repeatedly throughout the day. You should seek shade when your shadow is shorter than you. Protect yourself by wearing long sleeves, pants, a wide-brimmed hat, and sunglasses year round, whenever you are outdoors.  Notify your caregiver of new moles or changes in moles, especially if there is a change in shape or color. Also notify your caregiver if a mole is larger than the size of a pencil eraser.  Stay current with your immunizations. Document Released: 07/04/2010 Document Revised: 04/15/2012 Document Reviewed: 07/04/2010 [facility] Patient Information 2014 [employer].

## 2013-11-07 NOTE — Progress Notes (Signed)
Tina [name] [birth year] [id number]        58 y.o.  G1P0010 for annual exam.  Doing well without complaints.  Past medical history,surgical history, problem list, medications, allergies, family history and social history were all reviewed and documented as reviewed in the EPIC chart.  ROS:  12 system ROS performed with pertinent positives and negatives included in the history, assessment and plan.   Additional significant findings :  none   Exam: Kim Counsellor Vitals:   11/07/13 1101  BP: 120/74  Height: 5\' 6"  (1.676 m)  Weight: 203 lb (92.08 kg)   General appearance:  Normal affect, orientation and appearance. Skin: Grossly normal HEENT: Without gross lesions.  No cervical or supraclavicular adenopathy. Thyroid normal.  Lungs:  Clear without wheezing, rales or rhonchi Cardiac: RR, without RMG Abdominal:  Soft, nontender, without masses, guarding, rebound, organomegaly or hernia Breasts:  Examined lying and sitting without masses, retractions, discharge or axillary adenopathy. Pelvic:  Ext/BUS/vagina normal with mild atrophic changes, Pap of cuff done  Adnexa  Without masses or tenderness    Anus and perineum  Normal   Rectovaginal  Normal sphincter tone without palpated masses or tenderness.    Assessment/Plan:  58 y.o. G1P0010 female for annual exam.   1. 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 No significant menopausal symptoms such as hot flashes night sweats vaginal dryness. Last Pap smear 2012.  Pap of cuff done today.  We'll continue to screen given history of high-grade dysplasia. 2. Mammography 09/2013. Patient having breast reductive surgery next week. We'll continue with annual mammography. SBE monthly reviewed. 3. DEXA 2011 normal. Plan repeat at 58.  Increased calcium and vitamin D recommendations reviewed. Check vitamin D level today. 4. Colonoscopy 2013. Repeat at their recommended interval. 5. Health maintenance. Baseline CBC comprehensive metabolic panel lipid  profile urinalysis TSH vitamin D ordered. Follow up in one year, sooner as needed.     Anastasio Auerbach MD, 11:17 AM 11/07/2013

## 2013-11-08 LAB — VITAMIN D 25 HYDROXY (VIT D DEFICIENCY, FRACTURES): Vit D, 25-Hydroxy: 39 ng/mL (ref 30–89)

## 2013-11-08 LAB — URINALYSIS W MICROSCOPIC + REFLEX CULTURE
Bacteria, UA: NONE SEEN
Bilirubin Urine: NEGATIVE
CASTS: NONE SEEN
Crystals: NONE SEEN
GLUCOSE, UA: NEGATIVE mg/dL
Hgb urine dipstick: NEGATIVE
Ketones, ur: NEGATIVE mg/dL
Leukocytes, UA: NEGATIVE
Nitrite: NEGATIVE
PH: 5 (ref 5.0–8.0)
PROTEIN: NEGATIVE mg/dL
SQUAMOUS EPITHELIAL / LPF: NONE SEEN
Specific Gravity, Urine: 1.022 (ref 1.005–1.030)
Urobilinogen, UA: 0.2 mg/dL (ref 0.0–1.0)

## 2013-11-10 LAB — CYTOLOGY - PAP

## 2013-11-11 ENCOUNTER — Other Ambulatory Visit: Payer: Self-pay | Admitting: Plastic Surgery

## 2013-12-18 ENCOUNTER — Telehealth: Payer: Self-pay | Admitting: *Deleted

## 2013-12-18 ENCOUNTER — Other Ambulatory Visit: Payer: Self-pay

## 2013-12-18 MED ORDER — ALPRAZOLAM 0.25 MG PO TABS: 0.2500 mg | ORAL_TABLET | Freq: Every evening | ORAL | Status: DC | PRN

## 2013-12-18 NOTE — Telephone Encounter (Signed)
Okay for Xanax 0.25 #30 one by mouth at bedtime when necessary sleep refill 2

## 2013-12-18 NOTE — Telephone Encounter (Signed)
Pt called pharmacy requesting refill on xanxa 0.25 mg tablet, last filled per pharmacist was 09/01/13. Please advise

## 2013-12-19 NOTE — Telephone Encounter (Signed)
Called into pharmacy

## 2013-12-19 NOTE — Telephone Encounter (Signed)
Rx called in 

## 2014-07-12 ENCOUNTER — Other Ambulatory Visit: Payer: Self-pay | Admitting: Gynecology

## 2014-07-13 NOTE — Telephone Encounter (Signed)
Called into pharmacy

## 2014-12-04 ENCOUNTER — Other Ambulatory Visit: Payer: Self-pay | Admitting: Gynecology

## 2014-12-04 NOTE — Telephone Encounter (Signed)
Ce Scheduled with you for 01/21/15.

## 2014-12-07 ENCOUNTER — Encounter: Payer: BC Managed Care – PPO | Admitting: Gynecology

## 2014-12-07 NOTE — Telephone Encounter (Signed)
Rx sent 

## 2015-01-21 ENCOUNTER — Encounter: Payer: BC Managed Care – PPO | Admitting: Gynecology

## 2015-03-04 ENCOUNTER — Ambulatory Visit (INDEPENDENT_AMBULATORY_CARE_PROVIDER_SITE_OTHER): Payer: BC Managed Care – PPO | Admitting: Gynecology

## 2015-03-04 ENCOUNTER — Encounter: Payer: Self-pay | Admitting: Gynecology

## 2015-03-04 VITALS — BP 120/76 | Ht 66.0 in | Wt 195.0 lb

## 2015-03-04 DIAGNOSIS — Z13220 Encounter for screening for lipoid disorders: Secondary | ICD-10-CM | POA: Diagnosis not present

## 2015-03-04 DIAGNOSIS — N952 Postmenopausal atrophic vaginitis: Secondary | ICD-10-CM | POA: Diagnosis not present

## 2015-03-04 DIAGNOSIS — Z1321 Encounter for screening for nutritional disorder: Secondary | ICD-10-CM

## 2015-03-04 DIAGNOSIS — Z1329 Encounter for screening for other suspected endocrine disorder: Secondary | ICD-10-CM

## 2015-03-04 DIAGNOSIS — Z01419 Encounter for gynecological examination (general) (routine) without abnormal findings: Secondary | ICD-10-CM

## 2015-03-04 LAB — CBC WITH DIFFERENTIAL/PLATELET
Basophils Absolute: 0 10*3/uL (ref 0.0–0.1)
Basophils Relative: 0 % (ref 0–1)
Eosinophils Absolute: 0.1 10*3/uL (ref 0.0–0.7)
Eosinophils Relative: 1 % (ref 0–5)
HCT: 38.9 % (ref 36.0–46.0)
Hemoglobin: 13.2 g/dL (ref 12.0–15.0)
Lymphocytes Relative: 37 % (ref 12–46)
Lymphs Abs: 2.5 10*3/uL (ref 0.7–4.0)
MCH: 31.1 pg (ref 26.0–34.0)
MCHC: 33.9 g/dL (ref 30.0–36.0)
MCV: 91.5 fL (ref 78.0–100.0)
MPV: 10.8 fL (ref 8.6–12.4)
Monocytes Absolute: 0.3 10*3/uL (ref 0.1–1.0)
Monocytes Relative: 5 % (ref 3–12)
Neutro Abs: 3.9 10*3/uL (ref 1.7–7.7)
Neutrophils Relative %: 57 % (ref 43–77)
Platelets: 256 10*3/uL (ref 150–400)
RBC: 4.25 MIL/uL (ref 3.87–5.11)
RDW: 14.1 % (ref 11.5–15.5)
WBC: 6.8 10*3/uL (ref 4.0–10.5)

## 2015-03-04 LAB — COMPREHENSIVE METABOLIC PANEL WITH GFR
ALT: 14 U/L (ref 6–29)
AST: 20 U/L (ref 10–35)
Albumin: 4 g/dL (ref 3.6–5.1)
Alkaline Phosphatase: 91 U/L (ref 33–130)
BUN: 16 mg/dL (ref 7–25)
CO2: 21 mmol/L (ref 20–31)
Calcium: 9.4 mg/dL (ref 8.6–10.4)
Chloride: 105 mmol/L (ref 98–110)
Creat: 0.81 mg/dL (ref 0.50–1.05)
Glucose, Bld: 99 mg/dL (ref 65–99)
Potassium: 4.2 mmol/L (ref 3.5–5.3)
Sodium: 139 mmol/L (ref 135–146)
Total Bilirubin: 1 mg/dL (ref 0.2–1.2)
Total Protein: 7.5 g/dL (ref 6.1–8.1)

## 2015-03-04 LAB — TSH: TSH: 1.03 m[IU]/L

## 2015-03-04 LAB — LIPID PANEL
Cholesterol: 225 mg/dL — ABNORMAL HIGH (ref 125–200)
HDL: 83 mg/dL (ref >=46)
LDL CALC: 129 mg/dL (ref <130)
Total CHOL/HDL Ratio: 2.7 Ratio (ref <=5.0)
Triglycerides: 66 mg/dL (ref <150)
VLDL: 13 mg/dL (ref <30)

## 2015-03-04 NOTE — Patient Instructions (Signed)

## 2015-03-04 NOTE — Progress Notes (Signed)
PERSEUS TAKALA 02-23-1955 [id number]        60 y.o.  G1P0010  for annual exam. Several issues noted below.  Past medical history,surgical history, problem list, medications, allergies, family history and social history were all reviewed and documented as reviewed in the EPIC chart.  ROS:  Performed with pertinent positives and negatives included in the history, assessment and plan.   Additional significant findings :  none   Exam: Caryn Bee assistant Filed Vitals:   03/04/15 1031  BP: 120/76  Height: 5\' 6"  (1.676 m)  Weight: 195 lb (88.451 kg)   General appearance:  Normal affect, orientation and appearance. Skin: Grossly normal HEENT: Without gross lesions.  No cervical or supraclavicular adenopathy. Thyroid normal.  Lungs:  Clear without wheezing, rales or rhonchi Cardiac: RR, without RMG Abdominal:  Soft, nontender, without masses, guarding, rebound, organomegaly or hernia Breasts:  Examined lying and sitting without masses, retractions, discharge or axillary adenopathy.  Bilateral reduction scars noted Pelvic:  Ext/BUS/vagina mild atrophic changes  Adnexa without masses or tenderness    Anus and perineum normal   Rectovaginal normal sphincter tone without palpated masses or tenderness.    Assessment/Plan:  60 y.o. G1P0010 female for annual exam.   1. Postmenopausal status post TAH 2007 for HGSIL. No significant hot flushes, night sweats or vaginal dryness. Continue to monitor and follow up if any issues. 2. Pap smear 11/2013. No Pap smear done today. History of HGSIL 2007. Continue with every 3 year screening. 3. DEXA 2011 normal. Plan repeat at age 68. Check vitamin D level today. 4. Mammography 2015. Had breast reduction surgery last year. Recently cleared to get her mammogram she's going to arrange for this. SBE monthly reviewed. 5. Colonoscopy 2013. Repeated their recommended interval. 6. Health maintenance. Patient in the process of arranging for a new primary  physician. Request baseline labs now. CBC, comprehensive metabolic panel, lipid profile, TSH, vitamin D and urinalysis ordered. Follow up 1 year, sooner as needed.   Anastasio Auerbach MD, 11:04 AM 03/04/2015

## 2015-03-05 ENCOUNTER — Other Ambulatory Visit: Payer: Self-pay | Admitting: Gynecology

## 2015-03-05 DIAGNOSIS — E7800 Pure hypercholesterolemia, unspecified: Secondary | ICD-10-CM

## 2015-03-05 DIAGNOSIS — E559 Vitamin D deficiency, unspecified: Secondary | ICD-10-CM

## 2015-03-05 LAB — URINALYSIS W MICROSCOPIC + REFLEX CULTURE
BACTERIA UA: NONE SEEN [HPF]
Bilirubin Urine: NEGATIVE
CASTS: NONE SEEN [LPF]
Glucose, UA: NEGATIVE
Hgb urine dipstick: NEGATIVE
KETONES UR: NEGATIVE
Leukocytes, UA: NEGATIVE
Nitrite: NEGATIVE
PROTEIN: NEGATIVE
SPECIFIC GRAVITY, URINE: 1.026 (ref 1.001–1.035)
Yeast: NONE SEEN [HPF]
pH: 5.5 (ref 5.0–8.0)

## 2015-03-05 LAB — VITAMIN D 25 HYDROXY (VIT D DEFICIENCY, FRACTURES): VIT D 25 HYDROXY: 24 ng/mL — AB (ref 30–100)

## 2015-03-06 LAB — URINE CULTURE

## 2015-10-20 ENCOUNTER — Other Ambulatory Visit: Payer: Self-pay | Admitting: Gynecology

## 2015-10-20 DIAGNOSIS — Z1231 Encounter for screening mammogram for malignant neoplasm of breast: Secondary | ICD-10-CM

## 2015-10-22 ENCOUNTER — Ambulatory Visit
Admission: RE | Admit: 2015-10-22 | Discharge: 2015-10-22 | Disposition: A | Discharge status: Home / Self Care | Payer: BC Managed Care – PPO | Source: Ambulatory Visit | Attending: Gynecology | Admitting: Gynecology

## 2015-10-22 DIAGNOSIS — Z1231 Encounter for screening mammogram for malignant neoplasm of breast: Secondary | ICD-10-CM

## 2015-11-12 ENCOUNTER — Ambulatory Visit: Payer: BC Managed Care – PPO

## 2016-03-30 ENCOUNTER — Encounter: Payer: Self-pay | Admitting: Gynecology

## 2016-03-30 ENCOUNTER — Ambulatory Visit (INDEPENDENT_AMBULATORY_CARE_PROVIDER_SITE_OTHER): Payer: BC Managed Care – PPO | Admitting: Gynecology

## 2016-03-30 VITALS — BP 120/82 | Ht 66.0 in | Wt 202.0 lb

## 2016-03-30 DIAGNOSIS — N952 Postmenopausal atrophic vaginitis: Secondary | ICD-10-CM

## 2016-03-30 DIAGNOSIS — Z01411 Encounter for gynecological examination (general) (routine) with abnormal findings: Secondary | ICD-10-CM

## 2016-03-30 NOTE — Progress Notes (Signed)
    Tina [name] 1955/10/24 [id number]        61 y.o.  G1P0010 for annual exam.    Past medical history,surgical history, problem list, medications, allergies, family history and social history were all reviewed and documented as reviewed in the EPIC chart.  ROS:  Performed with pertinent positives and negatives included in the history, assessment and plan.   Additional significant findings :  None   Exam: Copywriter, advertising Vitals:   03/30/16 1000  BP: 120/82  Weight: 202 lb (91.6 kg)  Height: 5\' 6"  (1.676 m)   Body mass index is 32.6 kg/m.  General appearance:  Normal affect, orientation and appearance. Skin: Grossly normal HEENT: Without gross lesions.  No cervical or supraclavicular adenopathy. Thyroid normal.  Lungs:  Clear without wheezing, rales or rhonchi Cardiac: RR, without RMG Abdominal:  Soft, nontender, without masses, guarding, rebound, organomegaly or hernia Breasts:  Examined lying and sitting without masses, retractions, discharge or axillary adenopathy.  Chest with well-healed bilateral reduction scars Pelvic:  Ext, BUS, Vagina: With atrophic changes  Adnexa: Without masses or tenderness    Anus and perineum: Normal   Rectovaginal: Normal sphincter tone without palpated masses or tenderness.    Assessment/Plan:  61 y.o. G45P0010 female for annual exam.   1. Postmenopausal/atrophic genital changes. Status post [facility] 2007 for HGSIL. Doing well without significant hot flushes, night sweats or vaginal dryness. 2. Pap smear 11/2013. Pap smear of vaginal cuff done today. History of HGSIL 2007. 3. Mammography 10/2015. Continue with annual mammography when due. SBE monthly reviewed. 4. DEXA 2011 normal. Plan repeat DEXA at age 78. 74. Colonoscopy 2013. Repeat at their recommended interval. 6. Health maintenance. No routine lab work done as patient does this elsewhere. Follow up in one year, sooner as needed.   Anastasio Auerbach MD, 10:19 AM 03/30/2016

## 2016-03-30 NOTE — Addendum Note (Signed)
Addended by: Thurnell Garbe A on: 03/30/2016 10:25 AM   Modules accepted: Orders

## 2016-03-30 NOTE — Patient Instructions (Signed)

## 2016-04-05 LAB — PAP IG W/ RFLX HPV ASCU

## 2016-11-08 ENCOUNTER — Other Ambulatory Visit: Payer: Self-pay | Admitting: Gynecology

## 2016-11-08 ENCOUNTER — Ambulatory Visit
Admission: RE | Admit: 2016-11-08 | Discharge: 2016-11-08 | Disposition: A | Discharge status: Home / Self Care | Payer: BC Managed Care – PPO | Source: Ambulatory Visit | Attending: Gynecology | Admitting: Gynecology

## 2016-11-08 DIAGNOSIS — Z1231 Encounter for screening mammogram for malignant neoplasm of breast: Secondary | ICD-10-CM

## 2017-09-21 ENCOUNTER — Encounter: Payer: Self-pay | Admitting: Gynecology

## 2017-09-21 ENCOUNTER — Ambulatory Visit (INDEPENDENT_AMBULATORY_CARE_PROVIDER_SITE_OTHER): Payer: BC Managed Care – PPO | Admitting: Gynecology

## 2017-09-21 VITALS — BP 126/80 | Ht 66.0 in | Wt 206.0 lb

## 2017-09-21 DIAGNOSIS — N952 Postmenopausal atrophic vaginitis: Secondary | ICD-10-CM | POA: Diagnosis not present

## 2017-09-21 DIAGNOSIS — Z01419 Encounter for gynecological examination (general) (routine) without abnormal findings: Secondary | ICD-10-CM

## 2017-09-21 DIAGNOSIS — R638 Other symptoms and signs concerning food and fluid intake: Secondary | ICD-10-CM | POA: Diagnosis not present

## 2017-09-21 DIAGNOSIS — E559 Vitamin D deficiency, unspecified: Secondary | ICD-10-CM | POA: Diagnosis not present

## 2017-09-21 DIAGNOSIS — Z13220 Encounter for screening for lipoid disorders: Secondary | ICD-10-CM

## 2017-09-21 NOTE — Patient Instructions (Signed)
Follow-up in 1 year for annual exam, sooner if any issues. 

## 2017-09-21 NOTE — Progress Notes (Signed)
    PATTY LEITZKE 1955-11-12 [id number]        62 y.o.  G1P0010 for annual gynecologic exam.  Overall doing well.  Having some issues maintaining her weight.  Past medical history,surgical history, problem list, medications, allergies, family history and social history were all reviewed and documented as reviewed in the EPIC chart.  ROS:  Performed with pertinent positives and negatives included in the history, assessment and plan.   Additional significant findings : None   Exam: Caryn Bee assistant Vitals:   09/21/17 1213  BP: 126/80  Weight: 206 lb (93.4 kg)  Height: 5\' 6"  (1.676 m)   Body mass index is 33.25 kg/m.  General appearance:  Normal affect, orientation and appearance. Skin: Grossly normal HEENT: Without gross lesions.  No cervical or supraclavicular adenopathy. Thyroid normal.  Lungs:  Clear without wheezing, rales or rhonchi Cardiac: RR, without RMG Abdominal:  Soft, nontender, without masses, guarding, rebound, organomegaly or hernia Breasts:  Examined lying and sitting without masses, retractions, discharge or axillary adenopathy. Pelvic:  Ext, BUS, Vagina: Normal with atrophic changes  Adnexa: Without masses or tenderness    Anus and perineum: Normal   Rectovaginal: Normal sphincter tone without palpated masses or tenderness.    Assessment/Plan:  62 y.o. G51P0010 female for annual gynecologic exam.   1. Postmenopausal/atrophic genital changes.  Status post [facility] 2007 for HGSIL.  No significant menopausal symptoms. 2. Difficulty maintaining weight.  We discussed the balance of exercise and diet.  We TSH for platelets. 3. History of vitamin D deficiency with vitamin D in the 20 range.  Is now taking vitamin D supplementation.  Check vitamin D level. 4. Mammography coming due in November and I reminded her to schedule this.  Rest exam normal today. 5. Colonoscopy 2013.  Repeat at their recommended interval. 6. Pap smear 2018.  No Pap smear done today.   History HGSIL 2007 as indication for her hysterectomy.  We will continue with vaginal surveillance every 3 years. 7. DEXA 2011 normal.  Plan repeat DEXA at age 46. 27. Health maintenance.  Requests baseline labs.  CBC, CMP, lipid profile TSH and vitamin D ordered.  Follow-up 1 year, sooner as needed.   Anastasio Auerbach MD, 12:33 PM 09/21/2017

## 2017-09-22 LAB — LIPID PANEL
CHOLESTEROL: 231 mg/dL — AB (ref <200)
HDL: 73 mg/dL (ref >50)
LDL Cholesterol (Calc): 140 mg/dL (calc) — ABNORMAL HIGH
Non-HDL Cholesterol (Calc): 158 mg/dL (calc) — ABNORMAL HIGH (ref <130)
Total CHOL/HDL Ratio: 3.2 (calc) (ref <5.0)
Triglycerides: 81 mg/dL (ref <150)

## 2017-09-22 LAB — CBC WITH DIFFERENTIAL/PLATELET
BASOS ABS: 70 {cells}/uL (ref 0–200)
Basophils Relative: 1 %
Eosinophils Absolute: 84 cells/uL (ref 15–500)
Eosinophils Relative: 1.2 %
HEMATOCRIT: 37.8 % (ref 35.0–45.0)
Hemoglobin: 12.7 g/dL (ref 11.7–15.5)
LYMPHS ABS: 2233 {cells}/uL (ref 850–3900)
MCH: 30 pg (ref 27.0–33.0)
MCHC: 33.6 g/dL (ref 32.0–36.0)
MCV: 89.2 fL (ref 80.0–100.0)
MPV: 11.7 fL (ref 7.5–12.5)
Monocytes Relative: 6.9 %
Neutro Abs: 4130 cells/uL (ref 1500–7800)
Neutrophils Relative %: 59 %
Platelets: 251 10*3/uL (ref 140–400)
RBC: 4.24 10*6/uL (ref 3.80–5.10)
RDW: 13.4 % (ref 11.0–15.0)
Total Lymphocyte: 31.9 %
WBC: 7 10*3/uL (ref 3.8–10.8)
WBCMIX: 483 {cells}/uL (ref 200–950)

## 2017-09-22 LAB — COMPREHENSIVE METABOLIC PANEL
AG Ratio: 1.6 (calc) (ref 1.0–2.5)
ALBUMIN MSPROF: 4.5 g/dL (ref 3.6–5.1)
ALKALINE PHOSPHATASE (APISO): 98 U/L (ref 33–130)
ALT: 15 U/L (ref 6–29)
AST: 18 U/L (ref 10–35)
BILIRUBIN TOTAL: 0.9 mg/dL (ref 0.2–1.2)
BUN: 17 mg/dL (ref 7–25)
CALCIUM: 9.4 mg/dL (ref 8.6–10.4)
CHLORIDE: 107 mmol/L (ref 98–110)
CO2: 23 mmol/L (ref 20–32)
Creat: 0.86 mg/dL (ref 0.50–0.99)
GLOBULIN: 2.9 g/dL (ref 1.9–3.7)
Glucose, Bld: 115 mg/dL — ABNORMAL HIGH (ref 65–99)
Potassium: 4.3 mmol/L (ref 3.5–5.3)
Sodium: 139 mmol/L (ref 135–146)
Total Protein: 7.4 g/dL (ref 6.1–8.1)

## 2017-09-22 LAB — TSH: TSH: 0.61 mIU/L (ref 0.40–4.50)

## 2017-09-22 LAB — VITAMIN D 25 HYDROXY (VIT D DEFICIENCY, FRACTURES): Vit D, 25-Hydroxy: 39 ng/mL (ref 30–100)

## 2017-09-26 ENCOUNTER — Other Ambulatory Visit: Payer: Self-pay | Admitting: *Deleted

## 2017-09-26 DIAGNOSIS — E7800 Pure hypercholesterolemia, unspecified: Secondary | ICD-10-CM

## 2017-09-26 DIAGNOSIS — R7309 Other abnormal glucose: Secondary | ICD-10-CM

## 2017-11-02 ENCOUNTER — Other Ambulatory Visit: Payer: Self-pay | Admitting: Gynecology

## 2017-11-02 DIAGNOSIS — Z1231 Encounter for screening mammogram for malignant neoplasm of breast: Secondary | ICD-10-CM

## 2017-11-13 ENCOUNTER — Encounter: Payer: BC Managed Care – PPO | Admitting: Gynecology

## 2017-11-20 ENCOUNTER — Ambulatory Visit
Admission: RE | Admit: 2017-11-20 | Discharge: 2017-11-20 | Disposition: A | Discharge status: Home / Self Care | Payer: BC Managed Care – PPO | Source: Ambulatory Visit | Attending: Gynecology | Admitting: Gynecology

## 2017-11-20 DIAGNOSIS — Z1231 Encounter for screening mammogram for malignant neoplasm of breast: Secondary | ICD-10-CM

## 2018-05-24 ENCOUNTER — Other Ambulatory Visit: Payer: Self-pay

## 2018-05-24 ENCOUNTER — Telehealth: Payer: Self-pay | Admitting: *Deleted

## 2018-05-24 ENCOUNTER — Encounter: Payer: Self-pay | Admitting: Obstetrics & Gynecology

## 2018-05-24 ENCOUNTER — Ambulatory Visit: Payer: BC Managed Care – PPO | Admitting: Obstetrics & Gynecology

## 2018-05-24 VITALS — BP 120/84 | HR 78 | Temp 97.3°F | Ht 66.0 in | Wt 209.0 lb

## 2018-05-24 DIAGNOSIS — R1031 Right lower quadrant pain: Secondary | ICD-10-CM

## 2018-05-24 DIAGNOSIS — R350 Frequency of micturition: Secondary | ICD-10-CM

## 2018-05-24 LAB — POCT URINALYSIS DIPSTICK
Bilirubin, UA: NEGATIVE
Blood, UA: NEGATIVE
Glucose, UA: NEGATIVE
Ketones, UA: NEGATIVE
Leukocytes, UA: NEGATIVE
Nitrite, UA: NEGATIVE
Protein, UA: NEGATIVE
Urobilinogen, UA: 0.2 E.U./dL
pH, UA: 5 (ref 5.0–8.0)

## 2018-05-24 MED ORDER — SULFAMETHOXAZOLE-TRIMETHOPRIM 800-160 MG PO TABS: 1.0000 | ORAL_TABLET | Freq: Two times a day (BID) | ORAL | 0 refills | Status: DC

## 2018-05-24 NOTE — Progress Notes (Signed)
GYNECOLOGY  VISIT  CC:  Pelvic discomfort and urinary frequency   HPI: 63 y.o. G1P0010 Married Tina [name] or [place] American female here for urinary frequency, vaginal and pelvic discomfort x 2 weeks.  Reports this started about two weeks ago with a "twingy type of pain" that started with intercourse with her husband.  Has been present during the last two weeks but has gradually increased.  States "I feel fine but don't want something to get worse over the weekend".  She is having some urinary urgency and dysuria.  She also notes some vaginal dryness.  She increased her water and started drinking cranberry juice especially that last 2 days.  She has been taking Azo just the last 24 hours.     Denies fever.  Denies vaginal discharge or bleeding.  Bowel movements are normal.    Pt typically seen at [facility] but desired appt today and neither provider is in the office today.  GYNECOLOGIC HISTORY: Patient's last menstrual period was 03/04/2015. Contraception: hysterectomy Menopausal hormone therapy: none  Patient Active Problem List   Diagnosis Date Noted  . Encounter for screening colonoscopy 08/10/2011  . Constipation 08/10/2011  . CIN III (cervical intraepithelial neoplasia III)   . CONJUNCTIVITIS NOS 08/29/2006  . ALLERGIC RHINITIS 08/29/2006  . ANEMIA-NOS 08/28/2006    Past Medical History:  Diagnosis Date  . Anxiety   . Arthritis   . CIN III (cervical intraepithelial neoplasia III) 2007   Hysterectomy    Past Surgical History:  Procedure Laterality Date  . ABDOMINAL HYSTERECTOMY    . BREAST SURGERY  2015   Reduction  . BUNIONECTOMY    . CERVICAL CONE BIOPSY  2006  . COLONOSCOPY  08/17/2011   Procedure: COLONOSCOPY;  Surgeon: Daneil Dolin, MD;  Location: [facility]ITE;  Service: Endoscopy;  Laterality: N/A;  7:30  . REDUCTION MAMMAPLASTY Bilateral     MEDS:   Current Outpatient Medications on File Prior to Visit  Medication Sig Dispense Refill  . BIOTIN PO Take by mouth.     . cetirizine (ZYRTEC) 10 MG tablet Take 10 mg by mouth daily.    . cholecalciferol (VITAMIN D) 1000 units tablet Take 1,000 Units by mouth daily.    . clindamycin (CLEOCIN T) 1 % lotion clindamycin 1 % lotion    . Garlic (GARLIQUE) 656 MG TBEC Take by mouth.    . hydroquinone 4 % cream hydroquinone 4 % topical cream    . hydroxypropyl methylcellulose (ISOPTO TEARS) 2.5 % ophthalmic solution Place 1 drop into both eyes 3 (three) times daily as needed. For dry eyes    . magnesium 30 MG tablet Take 30 mg by mouth 2 (two) times daily.    . Multiple Vitamin (MULTIVITAMIN) tablet Take 1 tablet by mouth daily.    . Omega-3 Fatty Acids (FISH OIL) 1000 MG CAPS Take by mouth.    . Plant Sterols and Stanols (CHOLEST OFF PO) Take by mouth daily.     No current facility-administered medications on file prior to visit.     ALLERGIES: Shellfish allergy; Ciprofloxacin; Macrobid [nitrofurantoin monohydrate macrocrystals]; and Neomycin  Family History  Problem Relation Age of Onset  . Stomach cancer Mother   . Hypertension Father   . Heart disease Father   . Prostate cancer Father   . Breast cancer Paternal Aunt        Age 61  . Prostate cancer Brother   . Colon cancer Paternal Aunt     SH:  Married, non  smoker  Review of Systems  Genitourinary: Positive for frequency and pelvic pain.  All other systems reviewed and are negative.   PHYSICAL EXAMINATION:    BP 120/84   Pulse 78   Temp (!) 97.3 F (36.3 C) (Temporal)   Ht 5\' 6"  (1.676 m)   Wt 209 lb (94.8 kg)   LMP 03/04/2015   BMI 33.73 kg/m     General appearance: alert, cooperative and appears stated age CV:  Regular rate and rhythm Lungs:  clear to auscultation, no wheezes, rales or rhonchi, symmetric air entry Flank: no CVA tenderness Abdomen: soft, non-tender; bowel sounds normal; no masses,  no organomegaly Lymph:  no inguinal LAD noted  Pelvic: External genitalia:  no lesions              Urethra:  normal appearing urethra  with no masses, tenderness or lesions              Bartholins and Skenes: normal                 Vagina: normal appearing vagina with normal color and discharge, no lesions              Cervix: absent              Bimanual Exam:  Uterus:  uterus absent              Adnexa: no mass, fullness, tenderness  Chaperone was present for exam.  Assessment: Urinary urgency and dysuria with mild RLQ pain but normal exam today  Plan: Urine culture pending Bactrim DS BID x 3 days Follow up with [facility] pending results.

## 2018-05-24 NOTE — Telephone Encounter (Signed)
Call from answering service. Patient requesting call back for UTI symptoms.   Call to patient. Reports urinary frequency and pelvic discomfort. Has tried AZO with some improvement. Denies fever or chills.  Advised OV recommended for evaluation. Appointment at 1145 today with Dr Sabra Heck (on call provider).  Routing to Dr Sabra Heck. Encounter closed.

## 2018-05-25 LAB — URINALYSIS, MICROSCOPIC ONLY
Bacteria, UA: NONE SEEN
Casts: NONE SEEN /lpf

## 2018-05-26 LAB — URINE CULTURE

## 2018-08-07 ENCOUNTER — Encounter: Payer: Self-pay | Admitting: Internal Medicine

## 2018-09-03 DIAGNOSIS — R87610 Atypical squamous cells of undetermined significance on cytologic smear of cervix (ASC-US): Secondary | ICD-10-CM

## 2018-09-03 HISTORY — DX: Atypical squamous cells of undetermined significance on cytologic smear of cervix (ASC-US): R87.610

## 2018-09-23 ENCOUNTER — Encounter: Payer: BC Managed Care – PPO | Admitting: Gynecology

## 2018-09-26 ENCOUNTER — Other Ambulatory Visit: Payer: Self-pay

## 2018-09-27 ENCOUNTER — Other Ambulatory Visit: Payer: Self-pay

## 2018-09-27 ENCOUNTER — Encounter: Payer: Self-pay | Admitting: Gynecology

## 2018-09-27 ENCOUNTER — Ambulatory Visit (INDEPENDENT_AMBULATORY_CARE_PROVIDER_SITE_OTHER): Payer: BC Managed Care – PPO | Admitting: Gynecology

## 2018-09-27 VITALS — BP 130/80 | Ht 65.5 in | Wt 204.0 lb

## 2018-09-27 DIAGNOSIS — E559 Vitamin D deficiency, unspecified: Secondary | ICD-10-CM

## 2018-09-27 DIAGNOSIS — Z13220 Encounter for screening for lipoid disorders: Secondary | ICD-10-CM | POA: Diagnosis not present

## 2018-09-27 DIAGNOSIS — R635 Abnormal weight gain: Secondary | ICD-10-CM

## 2018-09-27 DIAGNOSIS — Z01419 Encounter for gynecological examination (general) (routine) without abnormal findings: Secondary | ICD-10-CM | POA: Diagnosis not present

## 2018-09-27 DIAGNOSIS — N952 Postmenopausal atrophic vaginitis: Secondary | ICD-10-CM | POA: Diagnosis not present

## 2018-09-27 DIAGNOSIS — F5101 Primary insomnia: Secondary | ICD-10-CM

## 2018-09-27 MED ORDER — ALPRAZOLAM 0.5 MG PO TABS: ORAL_TABLET | ORAL | 3 refills | Status: DC

## 2018-09-27 NOTE — Addendum Note (Signed)
Addended by: Nelva Nay on: 09/27/2018 03:05 PM   Modules accepted: Orders

## 2018-09-27 NOTE — Progress Notes (Signed)
    LLESENIA TROTH 08-16-1955 [id number]        63 y.o.  G1P0010 for annual gynecologic exam.  Having some issues with insomnia.  Past medical history,surgical history, problem list, medications, allergies, family history and social history were all reviewed and documented as reviewed in the EPIC chart.  ROS:  Performed with pertinent positives and negatives included in the history, assessment and plan.   Additional significant findings : None   Exam: Caryn Bee assistant Vitals:   09/27/18 1426  BP: 130/80  Weight: 204 lb (92.5 kg)  Height: 5' 5.5" (1.664 m)   Body mass index is 33.43 kg/m.  General appearance:  Normal affect, orientation and appearance. Skin: Grossly normal HEENT: Without gross lesions.  No cervical or supraclavicular adenopathy. Thyroid normal.  Lungs:  Clear without wheezing, rales or rhonchi Cardiac: RR, without RMG Abdominal:  Soft, nontender, without masses, guarding, rebound, organomegaly or hernia Breasts:  Examined lying and sitting without masses, retractions, discharge or axillary adenopathy. Pelvic:  Ext, BUS, Vagina: Normal with atrophic changes.  Pap smear done  Adnexa: Without masses or tenderness    Anus and perineum: Normal   Rectovaginal: Normal sphincter tone without palpated masses or tenderness.    Assessment/Plan:  63 y.o. G9P0010 female for annual gynecologic exam.   1. Postmenopausal.  No significant menopausal symptoms.  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 2. Insomnia.  Having difficulty falling asleep.  Feels it is due to all the anxiety with COVID.  Had a trial of Xanax when she was dealing with an ailing parent previously and did well with this.  Xanax 0.5 mg one half tab at bedtime #30 refill x3. 3. Pap smear 03/2016.  Pap smear done today.  History of HGSIL 2007.  Will continue with Pap smear surveillance. 4. Mammography coming due in November and I reminded her to schedule this.  Breast exam normal today. 5. DEXA 2011 normal.   Recommend repeat DEXA age 53.  Check vitamin D level today. 6. Colonoscopy 2013.  Is in the process of arranging now. 7. Health maintenance.  Reports being fasting now.  CBC, CMP, lipid profile, TSH and vitamin D ordered.  Follow-up 1 year, sooner as needed.   Anastasio Auerbach MD, 2:53 PM 09/27/2018

## 2018-09-27 NOTE — Patient Instructions (Signed)
Follow-up for annual exam in 1 year 

## 2018-09-28 LAB — LIPID PANEL
Cholesterol: 243 mg/dL — ABNORMAL HIGH (ref <200)
HDL: 67 mg/dL (ref >=50)
LDL Cholesterol (Calc): 157 mg/dL (calc) — ABNORMAL HIGH
Non-HDL Cholesterol (Calc): 176 mg/dL (calc) — ABNORMAL HIGH (ref <130)
Total CHOL/HDL Ratio: 3.6 (calc) (ref <5.0)
Triglycerides: 85 mg/dL (ref <150)

## 2018-09-28 LAB — COMPREHENSIVE METABOLIC PANEL
AG Ratio: 1.5 (calc) (ref 1.0–2.5)
ALT: 14 U/L (ref 6–29)
AST: 22 U/L (ref 10–35)
Albumin: 4.2 g/dL (ref 3.6–5.1)
Alkaline phosphatase (APISO): 92 U/L (ref 37–153)
BUN: 12 mg/dL (ref 7–25)
CO2: 22 mmol/L (ref 20–32)
Calcium: 9.2 mg/dL (ref 8.6–10.4)
Chloride: 108 mmol/L (ref 98–110)
Creat: 0.81 mg/dL (ref 0.50–0.99)
Globulin: 2.8 g/dL (calc) (ref 1.9–3.7)
Glucose, Bld: 104 mg/dL — ABNORMAL HIGH (ref 65–99)
Potassium: 4.7 mmol/L (ref 3.5–5.3)
Sodium: 141 mmol/L (ref 135–146)
Total Bilirubin: 0.7 mg/dL (ref 0.2–1.2)
Total Protein: 7 g/dL (ref 6.1–8.1)

## 2018-09-28 LAB — CBC WITH DIFFERENTIAL/PLATELET
Absolute Monocytes: 536 cells/uL (ref 200–950)
Basophils Absolute: 47 cells/uL (ref 0–200)
Basophils Relative: 0.7 %
Eosinophils Absolute: 67 cells/uL (ref 15–500)
Eosinophils Relative: 1 %
HCT: 36.9 % (ref 35.0–45.0)
Hemoglobin: 12.3 g/dL (ref 11.7–15.5)
Lymphs Abs: 2265 cells/uL (ref 850–3900)
MCH: 29.6 pg (ref 27.0–33.0)
MCHC: 33.3 g/dL (ref 32.0–36.0)
MCV: 88.7 fL (ref 80.0–100.0)
MPV: 11.4 fL (ref 7.5–12.5)
Monocytes Relative: 8 %
Neutro Abs: 3786 cells/uL (ref 1500–7800)
Neutrophils Relative %: 56.5 %
Platelets: 273 10*3/uL (ref 140–400)
RBC: 4.16 10*6/uL (ref 3.80–5.10)
RDW: 13.4 % (ref 11.0–15.0)
Total Lymphocyte: 33.8 %
WBC: 6.7 10*3/uL (ref 3.8–10.8)

## 2018-09-28 LAB — VITAMIN D 25 HYDROXY (VIT D DEFICIENCY, FRACTURES): Vit D, 25-Hydroxy: 37 ng/mL (ref 30–100)

## 2018-09-28 LAB — TSH: TSH: 0.82 mIU/L (ref 0.40–4.50)

## 2018-10-01 ENCOUNTER — Encounter: Payer: Self-pay | Admitting: Gynecology

## 2018-10-02 ENCOUNTER — Encounter: Payer: Self-pay | Admitting: Gynecology

## 2018-10-02 LAB — PAP IG W/ RFLX HPV ASCU

## 2018-10-02 LAB — HUMAN PAPILLOMAVIRUS, HIGH RISK: HPV DNA High Risk: NOT DETECTED

## 2018-10-31 ENCOUNTER — Telehealth: Payer: Self-pay | Admitting: *Deleted

## 2018-10-31 NOTE — Telephone Encounter (Signed)
Patient asking the name of hand specialist due to carpal tunnel, I gave patient name of Tina [name] at [place] [phone]

## 2018-11-21 ENCOUNTER — Other Ambulatory Visit: Payer: Self-pay | Admitting: Gynecology

## 2018-11-21 DIAGNOSIS — Z1231 Encounter for screening mammogram for malignant neoplasm of breast: Secondary | ICD-10-CM

## 2019-01-13 ENCOUNTER — Other Ambulatory Visit: Payer: Self-pay

## 2019-01-13 ENCOUNTER — Other Ambulatory Visit: Payer: Self-pay | Admitting: Internal Medicine

## 2019-01-13 ENCOUNTER — Ambulatory Visit
Admission: RE | Admit: 2019-01-13 | Discharge: 2019-01-13 | Disposition: A | Discharge status: Home / Self Care | Payer: BC Managed Care – PPO | Source: Ambulatory Visit | Attending: Gynecology | Admitting: Gynecology

## 2019-01-13 DIAGNOSIS — Z1231 Encounter for screening mammogram for malignant neoplasm of breast: Secondary | ICD-10-CM

## 2019-01-15 ENCOUNTER — Other Ambulatory Visit: Payer: Self-pay | Admitting: Internal Medicine

## 2019-01-15 DIAGNOSIS — R928 Other abnormal and inconclusive findings on diagnostic imaging of breast: Secondary | ICD-10-CM

## 2019-01-16 ENCOUNTER — Ambulatory Visit
Admission: RE | Admit: 2019-01-16 | Discharge: 2019-01-16 | Disposition: A | Discharge status: Home / Self Care | Payer: BC Managed Care – PPO | Source: Ambulatory Visit | Attending: Internal Medicine | Admitting: Internal Medicine

## 2019-01-16 ENCOUNTER — Other Ambulatory Visit: Payer: Self-pay | Admitting: Internal Medicine

## 2019-01-16 ENCOUNTER — Other Ambulatory Visit: Payer: Self-pay

## 2019-01-16 DIAGNOSIS — R921 Mammographic calcification found on diagnostic imaging of breast: Secondary | ICD-10-CM

## 2019-01-16 DIAGNOSIS — R928 Other abnormal and inconclusive findings on diagnostic imaging of breast: Secondary | ICD-10-CM

## 2019-01-21 ENCOUNTER — Telehealth: Payer: Self-pay | Admitting: Internal Medicine

## 2019-01-21 ENCOUNTER — Encounter: Payer: Self-pay | Admitting: Internal Medicine

## 2019-01-21 NOTE — Telephone Encounter (Signed)
NV made and letter mailed 

## 2019-01-21 NOTE — Telephone Encounter (Signed)
Pt had TCS by RMR in 2013 and recommended to have another in 7 years. She was due back in 2020 but was scared to schedule due to covid. She is wanting to schedule now. Does she need a nurse visit or to come into the office? Please advise.

## 2019-01-21 NOTE — Telephone Encounter (Signed)
The patient will need a nurse visit

## 2019-01-22 ENCOUNTER — Inpatient Hospital Stay: Admission: RE | Admit: 2019-01-22 | Payer: BC Managed Care – PPO | Source: Ambulatory Visit

## 2019-01-29 ENCOUNTER — Ambulatory Visit
Admission: RE | Admit: 2019-01-29 | Discharge: 2019-01-29 | Disposition: A | Discharge status: Home / Self Care | Payer: BC Managed Care – PPO | Source: Ambulatory Visit | Attending: Internal Medicine | Admitting: Internal Medicine

## 2019-01-29 DIAGNOSIS — R921 Mammographic calcification found on diagnostic imaging of breast: Secondary | ICD-10-CM

## 2019-03-12 ENCOUNTER — Ambulatory Visit: Payer: BC Managed Care – PPO | Attending: Internal Medicine

## 2019-03-12 DIAGNOSIS — Z23 Encounter for immunization: Secondary | ICD-10-CM

## 2019-03-12 NOTE — Progress Notes (Signed)
   Covid-19 Vaccination Clinic  Name:  Tina [name]    MRN: [id number] DOB: [birth year]  03/12/2019  [name] was observed post Covid-19 immunization for 15 minutes without incident. She was provided with Vaccine Information Sheet and instruction to access the V-Safe system.   [name] was instructed to call 911 with any severe reactions post vaccine: Marland Kitchen Difficulty breathing  . Swelling of face and throat  . A fast heartbeat  . A bad rash all over body  . Dizziness and weakness   Immunizations Administered    Name Date Dose VIS Date Route   Moderna COVID-19 Vaccine 03/12/2019  3:21 PM 0.5 mL 12/03/2018 Intramuscular   Manufacturer: Moderna   Lot: [id number]   [id number]

## 2019-04-12 ENCOUNTER — Ambulatory Visit: Payer: BC Managed Care – PPO | Attending: Internal Medicine

## 2019-04-12 DIAGNOSIS — Z23 Encounter for immunization: Secondary | ICD-10-CM

## 2019-04-12 NOTE — Progress Notes (Signed)
   Covid-19 Vaccination Clinic  Name:  DALIS BROKER    MRN: [id number] DOB: [birth year]  04/12/2019  Ms. Nordby was observed post Covid-19 immunization for 15 minutes without incident. She was provided with Vaccine Information Sheet and instruction to access the V-Safe system.   Ms. Dexheimer was instructed to call 911 with any severe reactions post vaccine: Marland Kitchen Difficulty breathing  . Swelling of face and throat  . A fast heartbeat  . A bad rash all over body  . Dizziness and weakness   Immunizations Administered    Name Date Dose VIS Date Route   Moderna COVID-19 Vaccine 04/12/2019 10:33 AM 0.5 mL 12/03/2018 Intramuscular   Manufacturer: Moderna   Lot: [id number]   [place] [id number]

## 2019-04-15 ENCOUNTER — Ambulatory Visit (INDEPENDENT_AMBULATORY_CARE_PROVIDER_SITE_OTHER): Payer: Self-pay | Admitting: *Deleted

## 2019-04-15 ENCOUNTER — Other Ambulatory Visit: Payer: Self-pay

## 2019-04-15 DIAGNOSIS — Z1211 Encounter for screening for malignant neoplasm of colon: Secondary | ICD-10-CM

## 2019-04-15 MED ORDER — PEG 3350-KCL-NA BICARB-NACL 420 G PO SOLR: 4000.0000 mL | Freq: Once | ORAL | 0 refills | Status: AC

## 2019-04-15 NOTE — Progress Notes (Signed)
Gastroenterology Pre-Procedure Review  Request Date: 04/15/2019 Requesting Physician: 7 year recall, Last TCS done 08/17/2011 by Dr. Gala Romney, 5 mm colon polyp, splenic flexure, did not survive processing  PATIENT REVIEW QUESTIONS: The patient responded to the following health history questions as indicated:    1. Diabetes Melitis: no 2. Joint replacements in the past 12 months: no 3. Major health problems in the past 3 months: no 4. Has an artificial valve or MVP: no 5. Has a defibrillator: no 6. Has been advised in past to take antibiotics in advance of a procedure like teeth cleaning: no 7. Family history of colon cancer: yes, aunt: age 63's 8. Alcohol Use: yes, 1 glass of wine monthly 9. Illicit drug Use: no 10. History of sleep apnea: no  11. History of coronary artery or other vascular stents placed within the last 12 months: no 12. History of any prior anesthesia complications: no 13. There is no height or weight on file to calculate BMI. ht: 5'5 wt: 212 lbs    MEDICATIONS & ALLERGIES:    Patient reports the following regarding taking any blood thinners:   Plavix? no Aspirin? no Coumadin? no Brilinta? no Xarelto? no Eliquis? no Pradaxa? no Savaysa? no Effient? no  Patient confirms/reports the following medications:  Current Outpatient Medications  Medication Sig Dispense Refill  . ALPRAZolam (XANAX) 0.5 MG tablet Take 1/2 tablet at bedtime as needed for sleep. 30 tablet 3  . Ascorbic Acid (VITAMIN C) 500 MG CAPS Take by mouth daily.    Marland Kitchen BIOTIN PO Take by mouth daily.     . cholecalciferol (VITAMIN D) 1000 units tablet Take 1,000 Units by mouth daily.    . clindamycin (CLEOCIN T) 1 % lotion clindamycin 1 % lotion    . GARLIC PO Take by mouth daily.    . hydroquinone 4 % cream hydroquinone 4 % topical cream    . hydroxypropyl methylcellulose (ISOPTO TEARS) 2.5 % ophthalmic solution Place 1 drop into both eyes as needed. For dry eyes     . Loratadine-Pseudoephedrine  (CLARITIN-D 12 HOUR PO) Take by mouth as needed.    . Multiple Vitamin (MULTIVITAMIN) tablet Take 1 tablet by mouth daily.    . Omega-3 Fatty Acids (FISH OIL) 1000 MG CAPS Take by mouth daily.     . Plant Sterols and Stanols (CHOLEST OFF PO) Take by mouth as needed.     . TURMERIC PO Take by mouth daily.     No current facility-administered medications for this visit.    Patient confirms/reports the following allergies:  Allergies  Allergen Reactions  . Shellfish Allergy Other (See Comments)    itching  . Ciprofloxacin Rash  . Macrobid [Nitrofurantoin Monohydrate Macrocrystals] Rash  . Neomycin Rash    No orders of the defined types were placed in this encounter.   AUTHORIZATION INFORMATION Primary Insurance: [place],  [place] #: [id number],  Group #: [id number] Pre-Cert / Auth required: No, not required  SCHEDULE INFORMATION: Procedure has been scheduled as follows:  Date: 06/11/2019, Time: 10:30 Location: [facility] with Dr. Gala Romney  This Gastroenterology Pre-Precedure Review Form is being routed to the following provider(s): Roseanne Kaufman, NP

## 2019-04-15 NOTE — Patient Instructions (Signed)
Tina [name]   11-Jun-1955 MRN: [id number]    Procedure Date: 06/11/2019 Time to register: 9:30 am Place to register: [facility] Procedure Time: 10:30 am Scheduled provider: Dr. Gala Romney  PREPARATION FOR COLONOSCOPY WITH TRI-LYTE SPLIT PREP  Please notify us immediately if you are diabetic, take iron supplements, or if you are on Coumadin or any other blood thinners.   Please hold the following medications: n/a  You will need to purchase 1 fleet enema and 1 box of Bisacodyl 42m tablets.   2 DAYS BEFORE PROCEDURE:  DATE: 06/09/2019   DAY: Monday Begin clear liquid diet AFTER your lunch meal. NO SOLID FOODS after this point.  1 DAY BEFORE PROCEDURE:  DATE: 06/10/2019   DAY: Tuesday Continue clear liquids the entire day - NO SOLID FOOD.   Diabetic medications adjustments for today: n/a  At 2:00 pm:  Take 2 Bisacodyl tablets.   At 4:00pm:  Start drinking your solution. Make sure you mix well per instructions on the bottle. Try to drink 1 (one) 8 ounce glass every 10-15 minutes until you have consumed HALF the jug. You should complete by 6:00pm.You must keep the left over solution refrigerated until completed next day.  Continue clear liquids. You must drink plenty of clear liquids to prevent dehyration and kidney failure.     DAY OF PROCEDURE:   DATE: 06/11/2019   DAY: Wednesday If you take medications for your heart, blood pressure or breathing, you may take these medications.  Diabetic medications adjustments for today: n/a  Five hours before your procedure time @ 5:30 am:  Finish remaining amout of bowel prep, drinking 1 (one) 8 ounce glass every 10-15 minutes until complete. You have two hours to consume remaining prep.   Three hours before your procedure time @ 7:30 am:  Nothing by mouth.   At least one hour before going to the hospital:  Give yourself one Fleet enema. You may take your morning medications with sip of water unless we have instructed otherwise.       Please see below for Dietary Information.  CLEAR LIQUIDS INCLUDE:  Water Jello (NOT red in color)   Ice Popsicles (NOT red in color)   Tea (sugar ok, no milk/cream) Powdered fruit flavored drinks  Coffee (sugar ok, no milk/cream) Gatorade/ Lemonade/ Kool-Aid  (NOT red in color)   Juice: apple, white grape, white cranberry Soft drinks  Clear bullion, consomme, broth (fat free beef/chicken/vegetable)  Carbonated beverages (any kind)  Strained chicken noodle soup Hard Candy   Remember: Clear liquids are liquids that will allow you to see your fingers on the other side of a clear glass. Be sure liquids are NOT red in color, and not cloudy, but CLEAR.  DO NOT EAT OR DRINK ANY OF THE FOLLOWING:  Dairy products of any kind   Cranberry juice Tomato juice / V8 juice   Grapefruit juice Orange juice     Red grape juice  Do not eat any solid foods, including such foods as: cereal, oatmeal, yogurt, fruits, vegetables, creamed soups, eggs, bread, crackers, pureed foods in a blender, etc.   HELPFUL HINTS FOR DRINKING PREP SOLUTION:   Make sure prep is extremely cold. Mix and refrigerate the the morning of the prep. You may also put in the freezer.   You may try mixing some Crystal Light or Country Time Lemonade if you prefer. Mix in small amounts; add more if necessary.  Try drinking through a straw  Rinse mouth with water  or a mouthwash between glasses, to remove after-taste.  Try sipping on a cold beverage /ice/ popsicles between glasses of prep.  Place a piece of sugar-free hard candy in mouth between glasses.  If you become nauseated, try consuming smaller amounts, or stretch out the time between glasses. Stop for 30-60 minutes, then slowly start back drinking.        OTHER INSTRUCTIONS  You will need a responsible adult at least 63 years of age to accompany you and drive you home. This person must remain in the waiting room during your procedure. The hospital will cancel  your procedure if you do not have a responsible adult with you.   1. Wear loose fitting clothing that is easily removed. 2. Leave jewelry and other valuables at home.  3. Remove all body piercing jewelry and leave at home. 4. Total time from sign-in until discharge is approximately 2-3 hours. 5. You should go home directly after your procedure and rest. You can resume normal activities the day after your procedure. 6. The day of your procedure you should not:  Drive  Make legal decisions  Operate machinery  Drink alcohol  Return to work   You may call the office (Dept: [phone]) before 5:00pm, or page the doctor on call [phone]) after 5:00pm, for further instructions, if necessary.   Insurance Information YOU WILL NEED TO CHECK WITH YOUR INSURANCE COMPANY FOR THE BENEFITS OF COVERAGE YOU HAVE FOR THIS PROCEDURE.  UNFORTUNATELY, NOT ALL INSURANCE COMPANIES HAVE BENEFITS TO COVER ALL OR PART OF THESE TYPES OF PROCEDURES.  IT IS YOUR RESPONSIBILITY TO CHECK YOUR BENEFITS, HOWEVER, WE WILL BE GLAD TO ASSIST YOU WITH ANY CODES YOUR INSURANCE COMPANY MAY NEED.    PLEASE NOTE THAT MOST INSURANCE COMPANIES WILL NOT COVER A SCREENING COLONOSCOPY FOR PEOPLE UNDER THE AGE OF 50  IF YOU HAVE BCBS INSURANCE, YOU MAY HAVE BENEFITS FOR A SCREENING COLONOSCOPY BUT IF POLYPS ARE FOUND THE DIAGNOSIS WILL CHANGE AND THEN YOU MAY HAVE A DEDUCTIBLE THAT WILL NEED TO BE MET. SO PLEASE MAKE SURE YOU CHECK YOUR BENEFITS FOR A SCREENING COLONOSCOPY AS WELL AS A DIAGNOSTIC COLONOSCOPY.

## 2019-04-21 NOTE — Progress Notes (Signed)
How often taking Xanax?

## 2019-04-22 NOTE — Progress Notes (Signed)
Called pt and she said that she takes Xanax (generic) 0.5 mg one to two times a month.

## 2019-04-30 NOTE — Progress Notes (Signed)
Appropriate.

## 2019-05-30 ENCOUNTER — Telehealth: Payer: Self-pay

## 2019-05-30 NOTE — Telephone Encounter (Signed)
Called pts home number and she answered. Aware that [facility] has her rx and she is going to call them to make sure her insurance will pay for it at that pharmacy. Pt will call back to let us know.

## 2019-05-30 NOTE — Telephone Encounter (Signed)
Pt called- Tina [name] is out of tri-lyte and pt is wanting it called in to [facility]. I called [facility] and they are out of it as well. I called [facility] and they have it.  I gave them a verbal order for rx. I tried to call and inform pt- NA- LM to return call.

## 2019-06-03 ENCOUNTER — Telehealth: Payer: Self-pay | Admitting: Internal Medicine

## 2019-06-03 NOTE — Telephone Encounter (Signed)
 pharmacy called and said they are out of prep that was sent in for the patient

## 2019-06-03 NOTE — Telephone Encounter (Addendum)
Tina [name] called pts home number and she answered on Friday. Aware that [name] has her rx and she is going to call them to make sure her insurance will pay for it at that pharmacy. Pt will call back to let us know.  Tried to call pt today to f/u but had to [facility].

## 2019-06-04 ENCOUNTER — Telehealth: Payer: Self-pay | Admitting: Internal Medicine

## 2019-06-04 NOTE — Telephone Encounter (Signed)
Tried to call pt again but had to leave a vm.

## 2019-06-04 NOTE — Telephone Encounter (Signed)
See other notes.  Spoke with Jonni Sanger and he is aware that pt had to get prep from [facility].

## 2019-06-04 NOTE — Telephone Encounter (Signed)
Pt has procedure scheduled for 06/11/19.  Will route to AB as FYI and see if any concerns as far as scheduled procedure.

## 2019-06-04 NOTE — Telephone Encounter (Signed)
[facility] DOES NOT HAVE PATIENT PREP- PLEASE SEND ALTERNATIVE

## 2019-06-04 NOTE — Telephone Encounter (Signed)
PATIENT CALLED AND SAID AT HER PRE OP PHONE CALL SHE FORGOT TO MENTION THAT SHE HAD CARPAL TUNNEL SYNDROME AND OCCASIONALLY HAS  SINUS ISSUES.  I TOLD HER I DID NOT THINK THAT WOULD AFFECT HER PROCEDURE BUT I WILL HAVE THE NURSES CALL IF IT DOES.

## 2019-06-04 NOTE — Telephone Encounter (Signed)
Routing to AS 

## 2019-06-04 NOTE — Telephone Encounter (Signed)
Spoke with pt and she said that she is picking up prep kit today from [facility].   °

## 2019-06-04 NOTE — Telephone Encounter (Signed)
Spoke with pt and she said that she is picking up prep kit today from [facility].

## 2019-06-05 NOTE — Telephone Encounter (Signed)
No concerns moving forward.

## 2019-06-05 NOTE — Telephone Encounter (Signed)
Lmom for pt to call us back. 

## 2019-06-05 NOTE — Telephone Encounter (Signed)
Called pt and made her aware that no concerns moving forward where the procedure is concerned per AB.  Pt voiced understanding.

## 2019-06-09 ENCOUNTER — Other Ambulatory Visit: Payer: Self-pay

## 2019-06-09 ENCOUNTER — Other Ambulatory Visit (HOSPITAL_COMMUNITY)
Admission: RE | Admit: 2019-06-09 | Discharge: 2019-06-09 | Disposition: A | Discharge status: Home / Self Care | Payer: BC Managed Care – PPO | Source: Ambulatory Visit | Attending: Internal Medicine | Admitting: Internal Medicine

## 2019-06-09 DIAGNOSIS — Z20822 Contact with and (suspected) exposure to covid-19: Secondary | ICD-10-CM | POA: Diagnosis not present

## 2019-06-09 DIAGNOSIS — Z01812 Encounter for preprocedural laboratory examination: Secondary | ICD-10-CM | POA: Diagnosis not present

## 2019-06-09 LAB — SARS CORONAVIRUS 2 (TAT 6-24 HRS): SARS Coronavirus 2: NEGATIVE

## 2019-06-11 ENCOUNTER — Ambulatory Visit (HOSPITAL_COMMUNITY)
Admission: RE | Admit: 2019-06-11 | Discharge: 2019-06-11 | Disposition: A | Discharge status: Home / Self Care | Payer: BC Managed Care – PPO | Attending: Internal Medicine | Admitting: Internal Medicine

## 2019-06-11 ENCOUNTER — Encounter (HOSPITAL_COMMUNITY): Payer: Self-pay | Admitting: Internal Medicine

## 2019-06-11 ENCOUNTER — Encounter (HOSPITAL_COMMUNITY)
Admission: RE | Disposition: A | Discharge status: Home / Self Care | Payer: Self-pay | Source: Home / Self Care | Attending: Internal Medicine

## 2019-06-11 ENCOUNTER — Other Ambulatory Visit: Payer: Self-pay

## 2019-06-11 DIAGNOSIS — Z1211 Encounter for screening for malignant neoplasm of colon: Secondary | ICD-10-CM | POA: Diagnosis present

## 2019-06-11 DIAGNOSIS — M1990 Unspecified osteoarthritis, unspecified site: Secondary | ICD-10-CM | POA: Diagnosis not present

## 2019-06-11 DIAGNOSIS — Z79899 Other long term (current) drug therapy: Secondary | ICD-10-CM | POA: Insufficient documentation

## 2019-06-11 DIAGNOSIS — Z87891 Personal history of nicotine dependence: Secondary | ICD-10-CM | POA: Insufficient documentation

## 2019-06-11 DIAGNOSIS — Z881 Allergy status to other antibiotic agents status: Secondary | ICD-10-CM | POA: Diagnosis not present

## 2019-06-11 HISTORY — PX: COLONOSCOPY: SHX5424

## 2019-06-11 SURGERY — COLONOSCOPY
Anesthesia: Moderate Sedation

## 2019-06-11 MED ORDER — ONDANSETRON HCL 4 MG/2ML IJ SOLN
INTRAMUSCULAR | Status: AC
  Filled 2019-06-11: qty 2

## 2019-06-11 MED ORDER — STERILE WATER FOR IRRIGATION IR SOLN: Status: DC | PRN

## 2019-06-11 MED ORDER — ONDANSETRON HCL 4 MG/2ML IJ SOLN
INTRAMUSCULAR | Status: DC | PRN
  Administered 2019-06-11: 4 mg via INTRAVENOUS

## 2019-06-11 MED ORDER — MIDAZOLAM HCL 5 MG/5ML IJ SOLN
INTRAMUSCULAR | Status: AC
  Filled 2019-06-11: qty 10

## 2019-06-11 MED ORDER — SODIUM CHLORIDE 0.9 % IV SOLN: INTRAVENOUS | Status: DC

## 2019-06-11 MED ORDER — MEPERIDINE HCL 50 MG/ML IJ SOLN
INTRAMUSCULAR | Status: AC
  Filled 2019-06-11: qty 1

## 2019-06-11 MED ORDER — MIDAZOLAM HCL 5 MG/5ML IJ SOLN
INTRAMUSCULAR | Status: DC | PRN
  Administered 2019-06-11: 2 mg via INTRAVENOUS
  Administered 2019-06-11 (×2): 1 mg via INTRAVENOUS
  Administered 2019-06-11: 2 mg via INTRAVENOUS

## 2019-06-11 MED ORDER — MEPERIDINE HCL 100 MG/ML IJ SOLN
INTRAMUSCULAR | Status: DC | PRN
  Administered 2019-06-11: 15 mg via INTRAVENOUS
  Administered 2019-06-11: 25 mg via INTRAVENOUS

## 2019-06-11 NOTE — H&P (Signed)
@LOGO @   Primary Care Physician:  Shon Baton, MD Primary Gastroenterologist:  Dr. Gala Romney  Pre-Procedure History & Physical: HPI:  Tina [name] is a 64 y.o. female here for screening colonoscopy.  Small polyp removed from her colon 2013-did not survive processing.  No family history of any first-degree relatives with colon cancer.  Here for screening colonoscopy per plan. Past Medical History:  Diagnosis Date  . Anxiety   . Arthritis   . ASCUS of cervix with negative high risk HPV 09/2018  . CIN III (cervical intraepithelial neoplasia III) 2007   Hysterectomy    Past Surgical History:  Procedure Laterality Date  . ABDOMINAL HYSTERECTOMY     bleeding, fibroids  . BREAST SURGERY  2015   Reduction  . BUNIONECTOMY    . CERVICAL CONE BIOPSY  2006  . COLONOSCOPY  08/17/2011   Procedure: COLONOSCOPY;  Surgeon: Daneil Dolin, MD;  Location: [facility];  Service: Endoscopy;  Laterality: N/A;  7:30  . REDUCTION MAMMAPLASTY Bilateral     Prior to Admission medications   Medication Sig Start Date End Date Taking? Authorizing Provider  acetaminophen (TYLENOL) 500 MG tablet Take 500-1,000 mg by mouth every 6 (six) hours as needed (PAIN.).   Yes [provider]  ALPRAZolam Duanne Moron) 0.5 MG tablet Take 1/2 tablet at bedtime as needed for sleep. Patient taking differently: Take 0.25-0.5 mg by mouth at bedtime as needed for sleep.  09/27/18  Yes Fontaine, Belinda Block, MD  APPLE CIDER VINEGAR PO Take 1 Dose by mouth daily as needed ("HEALTH BENEFITS").   Yes [provider]  ascorbic acid (VITAMIN C) 500 MG tablet Take 500 mg by mouth daily after lunch.   Yes [provider]  Biotin 5000 MCG TABS Take 5,000 mcg by mouth daily after lunch. Hair Hero   Yes [provider]  Cholecalciferol (VITAMIN D3) 50 MCG (2000 UT) TABS Take 2,000 Units by mouth daily after lunch.   Yes [provider]  clindamycin (CLEOCIN T) 1 % lotion Apply 1 application  topically daily.    Yes [provider]  diphenhydrAMINE (BENADRYL) 25 mg capsule Take 25 mg by mouth every 6 (six) hours as needed (sinus issues.).   Yes [provider]  GARLIC PO Take 2 capsules by mouth daily after lunch. Aged Garlic Extract   Yes [provider]  hydroquinone 4 % cream Apply 1 application topically daily as needed (dark spots).    Yes [provider]  hydroxypropyl methylcellulose (ISOPTO TEARS) 2.5 % ophthalmic solution Place 1 drop into both eyes 4 (four) times daily as needed (dry/irritated eyes.).    Yes [provider]  loratadine (CLARITIN) 10 MG tablet Take 10 mg by mouth daily as needed for allergies.   Yes [provider]  Loratadine-Pseudoephedrine (CLARITIN-D 12 HOUR PO) Take 1 tablet by mouth daily as needed (allergies.).    Yes [provider]  MEGARED OMEGA-3 KRILL OIL PO Take 500 mg by mouth daily after lunch.   Yes [provider]  Multiple Vitamin (MULTIVITAMIN WITH MINERALS) TABS tablet Take 1 tablet by mouth daily after lunch.   Yes [provider]  TURMERIC PO Take 2,000 mg by mouth daily after lunch. 1000 mg/per capsule   Yes [provider]    Allergies as of 04/30/2019 - Review Complete 04/15/2019  Allergen Reaction Noted  . Shellfish allergy Other (See Comments) 08/11/2011  . Ciprofloxacin Rash 08/01/2011  . Macrobid [nitrofurantoin monohydrate macrocrystals] Rash 03/24/2011  .  Neomycin Rash 08/01/2011    Family History  Problem Relation Age of Onset  . Stomach cancer Mother   . Hypertension Father   . Heart disease Father   . Prostate cancer Father   . Breast cancer Paternal Aunt        Age 26  . Prostate cancer Brother   . Colon cancer Paternal Aunt     Social History   Socioeconomic History  . Marital status: Married    Spouse name: Not on file  . Number of children: Not on file  . Years of education: Not on file  . Highest education level:  Not on file  Occupational History  . Occupation: 3rd grade teacher    Employer: [employer]  Tobacco Use  . Smoking status: Former Research scientist (life sciences)  . Smokeless tobacco: Never Used  Substance and Sexual Activity  . Alcohol use: Yes    Alcohol/week: 1.0 standard drinks    Types: 1 Standard drinks or equivalent per week    Comment: occasional  . Drug use: No  . Sexual activity: Yes    Birth control/protection: Surgical    Comment: HYST-1st intercourse 64 yo-Fewer than 5 partners  Other Topics Concern  . Not on file  Social History Narrative  . Not on file   Social Determinants of Health   Financial Resource Strain:   . Difficulty of Paying Living Expenses:   Food Insecurity:   . Worried About Charity fundraiser in the Last Year:   . Arboriculturist in the Last Year:   Transportation Needs:   . Film/video editor (Medical):   Marland Kitchen Lack of Transportation (Non-Medical):   Physical Activity:   . Days of Exercise per Week:   . Minutes of Exercise per Session:   Stress:   . Feeling of Stress :   Social Connections:   . Frequency of Communication with Friends and Family:   . Frequency of Social Gatherings with Friends and Family:   . Attends Religious Services:   . Active Member of Clubs or Organizations:   . Attends Archivist Meetings:   Marland Kitchen Marital Status:   Intimate Partner Violence:   . Fear of Current or Ex-Partner:   . Emotionally Abused:   Marland Kitchen Physically Abused:   . Sexually Abused:     Review of Systems: See HPI, otherwise negative ROS  Physical Exam: BP 130/70   Pulse 64   Temp 98.8 F (37.1 C) (Oral)   Resp (!) 9   Ht 5' 5.5" (1.664 m)   Wt 91.2 kg   LMP 03/04/2015   SpO2 100%   BMI 32.94 kg/m  General:   Alert,  Well-developed, well-nourished, pleasant and cooperative in NAD Neck:  Supple; no masses or thyromegaly. No significant cervical adenopathy. Lungs:  Clear throughout to auscultation.   No wheezes, crackles, or rhonchi. No acute  distress. Heart:  Regular rate and rhythm; no murmurs, clicks, rubs,  or gallops. Abdomen: Non-distended, normal bowel sounds.  Soft and nontender without appreciable mass or hepatosplenomegaly.  Pulses:  Normal pulses noted. Extremities:  Without clubbing or edema.  Impression/Plan: 64 year old lady here for average rescreening colonoscopy.  The risks, benefits, limitations, alternatives and imponderables have been reviewed with the patient. Questions have been answered. All parties are agreeable.      Notice: This dictation was prepared with Dragon dictation along with smaller phrase technology. Any transcriptional errors that result from this process are unintentional and may not be corrected  upon review.

## 2019-06-11 NOTE — Op Note (Signed)
[facility] Patient Name: Tina [name] Procedure Date: 06/11/2019 9:50 AM MRN: [id number] Date of Birth: 30-Sep-1955 Attending MD: Norvel Richards , MD CSN: [id number] Age: 64 Admit Type: Outpatient Procedure:                Colonoscopy Indications:              Screening for colorectal malignant neoplasm Providers:                Norvel Richards, MD, Tammy Vaught, RN, Crystal                            Page, Aram Candela Referring MD:              Medicines:                Midazolam 6 mg IV, Meperidine 40 mg IV, Ondansetron                            4 mg IV Complications:            No immediate complications. Estimated Blood Loss:     Estimated blood loss: none. Procedure:                Pre-Anesthesia Assessment:                           - Prior to the procedure, a History and Physical                            was performed, and patient medications and                            allergies were reviewed. The patient's tolerance of                            previous anesthesia was also reviewed. The risks                            and benefits of the procedure and the sedation                            options and risks were discussed with the patient.                            All questions were answered, and informed consent                            was obtained. Prior Anticoagulants: The patient has                            taken no previous anticoagulant or antiplatelet                            agents. ASA Grade Assessment: II - A patient with  mild systemic disease. After reviewing the risks                            and benefits, the patient was deemed in                            satisfactory condition to undergo the procedure.                           After obtaining informed consent, the colonoscope                            was passed under direct vision. Throughout the                            procedure, the  patient's blood pressure, pulse, and                            oxygen saturations were monitored continuously. The                            CF-HQ190L ([id number]) scope was introduced through                            the anus and advanced to the the cecum, identified                            by appendiceal orifice and ileocecal valve. The                            colonoscopy was performed without difficulty. The                            patient tolerated the procedure well. The quality                            of the bowel preparation was adequate. Scope In: 10:07:24 AM Scope Out: 10:19:38 AM Scope Withdrawal Time: 0 hours 7 minutes 54 seconds  Total Procedure Duration: 0 hours 12 minutes 14 seconds  Findings:      The perianal and digital rectal examinations were normal.      The colon appeared normal.      The retroflexed view of the distal rectum and anal verge was normal and       showed no anal or rectal abnormalities. Impression:               - The entire examined colon is normal.                           - The distal rectum and anal verge are normal on                            retroflexion view.                           -  No specimens collected. Moderate Sedation:      Moderate (conscious) sedation was administered by the endoscopy nurse       and supervised by the endoscopist. The following parameters were       monitored: oxygen saturation, heart rate, blood pressure, and response       to care. Total physician intraservice time was 23 minutes. Recommendation:           - Patient has a contact number available for                            emergencies. The signs and symptoms of potential                            delayed complications were discussed with the                            patient. Return to normal activities tomorrow.                            Written discharge instructions were provided to the                            patient.                            - Resume previous diet.                           - Continue present medications.                           - Repeat colonoscopy in 10 years for screening                            purposes.                           - Return to GI office (date not yet determined). Procedure Code(s):        --- Professional ---                           [phone], Colonoscopy, flexible; diagnostic, including                            collection of specimen(s) by brushing or washing,                            when performed (separate procedure)                           99153, Moderate sedation; each additional 15                            minutes intraservice time                           G0500,  Moderate sedation services provided by the                            same physician or other qualified health care                            professional performing a gastrointestinal                            endoscopic service that sedation supports,                            requiring the presence of an independent trained                            observer to assist in the monitoring of the                            patient's level of consciousness and physiological                            status; initial 15 minutes of intra-service time;                            patient age 76 years or older (additional time may                            be reported with [phone], as appropriate) Diagnosis Code(s):        --- Professional ---                           Z12.11, Encounter for screening for malignant                            neoplasm of colon CPT copyright 2019 [employer]. All rights reserved. The codes documented in this report are preliminary and upon coder review may  be revised to meet current compliance requirements. Cristopher Estimable. Jedrek Dinovo, MD Norvel Richards, MD 06/11/2019 10:26:16 AM This report has been signed electronically. Number of Addenda: 0

## 2019-06-11 NOTE — Discharge Instructions (Signed)
  Colonoscopy Discharge Instructions  Read the instructions outlined below and refer to this sheet in the next few weeks. These discharge instructions provide you with general information on caring for yourself after you leave the hospital. Your doctor may also give you specific instructions. While your treatment has been planned according to the most current medical practices available, unavoidable complications occasionally occur. If you have any problems or questions after discharge, call Dr. Gala Romney at [phone]. ACTIVITY  You may resume your regular activity, but move at a slower pace for the next 24 hours.   Take frequent rest periods for the next 24 hours.   Walking will help get rid of the air and reduce the bloated feeling in your belly (abdomen).   No driving for 24 hours (because of the medicine (anesthesia) used during the test).    Do not sign any important legal documents or operate any machinery for 24 hours (because of the anesthesia used during the test).  NUTRITION  Drink plenty of fluids.   You may resume your normal diet as instructed by your doctor.   Begin with a light meal and progress to your normal diet. Heavy or fried foods are harder to digest and may make you feel sick to your stomach (nauseated).   Avoid alcoholic beverages for 24 hours or as instructed.  MEDICATIONS  You may resume your normal medications unless your doctor tells you otherwise.  WHAT YOU CAN EXPECT TODAY  Some feelings of bloating in the abdomen.   Passage of more gas than usual.   Spotting of blood in your stool or on the toilet paper.  IF YOU HAD POLYPS REMOVED DURING THE COLONOSCOPY:  No aspirin products for 7 days or as instructed.   No alcohol for 7 days or as instructed.   Eat a soft diet for the next 24 hours.  FINDING OUT THE RESULTS OF YOUR TEST Not all test results are available during your visit. If your test results are not back during the visit, make an appointment  with your caregiver to find out the results. Do not assume everything is normal if you have not heard from your caregiver or the medical facility. It is important for you to follow up on all of your test results.  SEEK IMMEDIATE MEDICAL ATTENTION IF:  You have more than a spotting of blood in your stool.   Your belly is swollen (abdominal distention).   You are nauseated or vomiting.   You have a temperature over 101.   You have abdominal pain or discomfort that is severe or gets worse throughout the day.   Your colon looked good today  I recommend a repeat colonoscopy in 10 years for screening purposes  At patient request, I called John Puskas at [phone] left a message with results

## 2019-06-12 NOTE — Telephone Encounter (Signed)
Communication noted.  

## 2019-06-12 NOTE — Telephone Encounter (Signed)
Pt called to thank everyone in our office for their professionalism. Pt states her TCS went well and she wanted to thank RMR . AB and AS,  pt wanted to thank you for answering her concerns.

## 2019-06-13 NOTE — Telephone Encounter (Signed)
Great! Good to hear.

## 2019-06-16 ENCOUNTER — Encounter (HOSPITAL_COMMUNITY): Payer: Self-pay | Admitting: Internal Medicine

## 2019-09-29 ENCOUNTER — Encounter: Payer: BC Managed Care – PPO | Admitting: Obstetrics and Gynecology

## 2019-10-24 ENCOUNTER — Encounter: Payer: Self-pay | Admitting: Obstetrics and Gynecology

## 2019-10-24 ENCOUNTER — Other Ambulatory Visit: Payer: Self-pay

## 2019-10-24 ENCOUNTER — Ambulatory Visit: Payer: BC Managed Care – PPO | Admitting: Obstetrics and Gynecology

## 2019-10-24 VITALS — BP 120/80 | Ht 65.0 in | Wt 188.0 lb

## 2019-10-24 DIAGNOSIS — Z01419 Encounter for gynecological examination (general) (routine) without abnormal findings: Secondary | ICD-10-CM

## 2019-10-24 DIAGNOSIS — R87610 Atypical squamous cells of undetermined significance on cytologic smear of cervix (ASC-US): Secondary | ICD-10-CM

## 2019-10-24 DIAGNOSIS — Z87410 Personal history of cervical dysplasia: Secondary | ICD-10-CM | POA: Diagnosis not present

## 2019-10-24 NOTE — Progress Notes (Signed)
Tina [name] 10-Sep-1955 [id number]  SUBJECTIVE:  64 y.o. G1P0010 female for annual routine gynecologic exam and Pap smear. She has no gynecologic concerns.  Current Outpatient Medications  Medication Sig Dispense Refill  . acetaminophen (TYLENOL) 500 MG tablet Take 500-1,000 mg by mouth every 6 (six) hours as needed (PAIN.).    Marland Kitchen ALPRAZolam (XANAX) 0.5 MG tablet Take 1/2 tablet at bedtime as needed for sleep. (Patient taking differently: Take 0.25-0.5 mg by mouth at bedtime as needed for sleep. ) 30 tablet 3  . APPLE CIDER VINEGAR PO Take 1 Dose by mouth daily as needed ("HEALTH BENEFITS").    Marland Kitchen ascorbic acid (VITAMIN C) 500 MG tablet Take 500 mg by mouth daily after lunch.    . Biotin 5000 MCG TABS Take 5,000 mcg by mouth daily after lunch. Hair Hero    . Cholecalciferol (VITAMIN D3) 50 MCG (2000 UT) TABS Take 2,000 Units by mouth daily after lunch.    . clindamycin (CLEOCIN T) 1 % lotion Apply 1 application topically daily.     Marland Kitchen Fexofenadine HCl (ALLEGRA PO) Take by mouth.    Marland Kitchen GARLIC PO Take 2 capsules by mouth daily after lunch. Aged Garlic Extract    . hydroquinone 4 % cream Apply 1 application topically daily as needed (dark spots).     . hydroxypropyl methylcellulose (ISOPTO TEARS) 2.5 % ophthalmic solution Place 1 drop into both eyes 4 (four) times daily as needed (dry/irritated eyes.).     Marland Kitchen MEGARED OMEGA-3 KRILL OIL PO Take 500 mg by mouth daily after lunch.    . Multiple Vitamin (MULTIVITAMIN WITH MINERALS) TABS tablet Take 1 tablet by mouth daily after lunch.    . predniSONE (DELTASONE) 10 MG tablet Take 10 mg by mouth 2 (two) times daily.    . TURMERIC PO Take 2,000 mg by mouth daily after lunch. 1000 mg/per capsule     No current facility-administered medications for this visit.   Allergies: Shellfish allergy, Ciprofloxacin, Macrobid [nitrofurantoin monohydrate macrocrystals], and Neomycin  Patient's last menstrual period was 03/04/2015.  Past medical  history,surgical history, problem list, medications, allergies, family history and social history were all reviewed and documented as reviewed in the EPIC chart.  ROS: Pertinent positives and negatives as reviewed in HPI   OBJECTIVE:  BP 120/80 (BP Location: Right Arm, Patient Position: Sitting, Cuff Size: Normal)   Ht 5\' 5"  (1.651 m)   Wt 188 lb (85.3 kg)   LMP 03/04/2015   BMI 31.28 kg/m  The patient appears well, alert, oriented, in no distress. ENT normal.  Neck supple. No cervical or supraclavicular adenopathy or thyromegaly.  Lungs are clear, good air entry, no wheezes, rhonchi or rales. S1 and S2 normal, no murmurs, regular rate and rhythm.  Abdomen soft without tenderness, guarding, mass or organomegaly.  Neurological is normal, no focal findings.  BREAST EXAM: breasts appear normal, no suspicious masses, no skin or nipple changes or axillary nodes  PELVIC EXAM: VULVA: normal appearing vulva with no masses, tenderness or lesions, VAGINA: normal appearing vagina with normal color and discharge, no lesions, CERVIX: Surgically absent, UTERUS: Surgically absent, vaginal cuff normal, ADNEXA: normal adnexa in size, nontender and no masses, PAP: Pap smear done today, thin-prep method, vaginal cuff  Chaperone: Wandra Scot Bonham present during the examination  ASSESSMENT:  64 y.o. G1P0010 here for annual gynecologic exam  PLAN:   1. Postmenopausal. 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 No significant hot flashes or night sweats.  Mild difficulty with  sleeping but says because she goes to bed too late and she will try to modify this.  Otherwise no significant symptoms.  No vaginal bleeding. 2. Pap smear 09/2018 ASCUS/negative HPV.  Pap smear is collected today.  History of CIN-3 in 2007. 3. Mammogram 01/2019.  Left breast biopsy performed 01/2019 was benign showing fibrocystic tissues with calcification and giant cell reaction.  Normal breast exam today.  She is reminded to schedule an annual  mammogram this year when due. 4. Colonoscopy 2021.  She will follow up at the interval recommended by her GI specialist.   5. DEXA 2011 normal.  Repeat at age 59. 6. Health maintenance.  No labs today as she had these completed with her primary care doctor.  Return annually or sooner, prn.  Joseph Pierini MD 10/24/19

## 2019-10-30 ENCOUNTER — Encounter: Payer: Self-pay | Admitting: Obstetrics and Gynecology

## 2019-10-30 LAB — HUMAN PAPILLOMAVIRUS, HIGH RISK: HPV DNA High Risk: NOT DETECTED

## 2019-10-30 LAB — PAP IG W/ RFLX HPV ASCU

## 2019-11-11 ENCOUNTER — Encounter: Payer: Self-pay | Admitting: Obstetrics & Gynecology

## 2020-02-02 ENCOUNTER — Other Ambulatory Visit: Payer: Self-pay | Admitting: Internal Medicine

## 2020-02-02 DIAGNOSIS — Z1231 Encounter for screening mammogram for malignant neoplasm of breast: Secondary | ICD-10-CM

## 2020-03-11 ENCOUNTER — Other Ambulatory Visit: Payer: Self-pay | Admitting: *Deleted

## 2020-03-11 MED ORDER — ALPRAZOLAM 0.5 MG PO TABS
ORAL_TABLET | ORAL | 0 refills | Status: DC
Start: 2020-03-11 — End: 2021-01-06

## 2020-03-19 ENCOUNTER — Inpatient Hospital Stay: Admission: RE | Admit: 2020-03-19 | Payer: BC Managed Care – PPO | Source: Ambulatory Visit

## 2020-05-11 ENCOUNTER — Ambulatory Visit
Admission: RE | Admit: 2020-05-11 | Discharge: 2020-05-11 | Disposition: A | Discharge status: Home / Self Care | Payer: BC Managed Care – PPO | Source: Ambulatory Visit | Attending: Internal Medicine | Admitting: Internal Medicine

## 2020-05-11 ENCOUNTER — Other Ambulatory Visit: Payer: Self-pay

## 2020-05-11 DIAGNOSIS — Z1231 Encounter for screening mammogram for malignant neoplasm of breast: Secondary | ICD-10-CM

## 2020-08-20 DIAGNOSIS — Z719 Counseling, unspecified: Secondary | ICD-10-CM

## 2020-11-04 ENCOUNTER — Ambulatory Visit: Payer: BC Managed Care – PPO | Admitting: Obstetrics and Gynecology

## 2021-01-05 NOTE — Progress Notes (Signed)
66 y.o. G48P0010 Married [place] American female here for annual exam.    Vaginal burning internal and externally. Vagina feels dry.  Tried Vagisil wash which did not really work.  Usually uses Dove soap.   No discharge.  No odor.   Uses Replens for hydration.   Does use dryer sheets.  Does not use pads.   PCP:   Shon Baton, MD  Patient's last menstrual period was 03/04/2015.           Sexually active: Yes.    The current method of family planning is hysterectomy for CIN III.    Exercising: Yes.     Elliptical, gym Smoker:  no  Health Maintenance: Pap:  10-24-19 ASCUS:Neg HR HPV, 09-27-18 ASCUS:Neg HR HPV, 03-30-16 Neg History of abnormal Pap:  yes, 10-24-19 ASCUS:Neg HR HPV, 09-27-18 ASCUS:Neg HR HPV, 2007 CIN III, 2006 Hx cone bx MMG:  05-11-20 Neg/BiRads1 Colonoscopy:  2021;next 10 years BMD:  2011  Result :Normal TDaP:  PCP Gardasil:   no Screening Labs:  PCP.   reports that she has quit smoking. She has never used smokeless tobacco. She reports that she does not currently use alcohol. She reports that she does not use drugs.  Past Medical History:  Diagnosis Date   Anxiety    Arthritis    ASCUS of cervix with negative high risk HPV 09/2018   CIN III (cervical intraepithelial neoplasia III) 2007   Hysterectomy    Past Surgical History:  Procedure Laterality Date   ABDOMINAL HYSTERECTOMY     bleeding, fibroids   BREAST SURGERY  2015   Reduction   BUNIONECTOMY     CERVICAL CONE BIOPSY  2006   COLONOSCOPY  08/17/2011   Procedure: COLONOSCOPY;  Surgeon: Daneil Dolin, MD;  Location: [facility];  Service: Endoscopy;  Laterality: N/A;  7:30   COLONOSCOPY N/A 06/11/2019   Procedure: COLONOSCOPY;  Surgeon: Daneil Dolin, MD;  Location: [facility];  Service: Endoscopy;  Laterality: N/A;  10:30   REDUCTION MAMMAPLASTY Bilateral     Current Outpatient Medications  Medication Sig Dispense Refill   acetaminophen (TYLENOL) 500 MG tablet Take 500-1,000 mg by mouth  every 6 (six) hours as needed (PAIN.).     ascorbic acid (VITAMIN C) 500 MG tablet Take 500 mg by mouth daily after lunch.     Cholecalciferol (VITAMIN D3) 50 MCG (2000 UT) TABS Take 2,000 Units by mouth daily after lunch.     clindamycin (CLEOCIN T) 1 % lotion Apply 1 application topically daily.      Fexofenadine HCl (ALLEGRA PO) Take by mouth.     GARLIC PO Take 2 capsules by mouth daily after lunch. Aged Garlic Extract     hydroxypropyl methylcellulose (ISOPTO TEARS) 2.5 % ophthalmic solution Place 1 drop into both eyes 4 (four) times daily as needed (dry/irritated eyes.).      levocetirizine (XYZAL) 2.5 MG/5ML solution Take 2.5 mg by mouth every evening.     MEGARED OMEGA-3 KRILL OIL PO Take 500 mg by mouth daily after lunch.     melatonin 5 MG TABS Take 5 mg by mouth at bedtime.     Multiple Vitamin (MULTIVITAMIN WITH MINERALS) TABS tablet Take 1 tablet by mouth daily after lunch.     saw palmetto 160 MG capsule Take 160 mg by mouth 2 (two) times daily.     TURMERIC PO Take 2,000 mg by mouth daily after lunch. 1000 mg/per capsule     No current facility-administered  medications for this visit.    Family History  Problem Relation Age of Onset   Stomach cancer Mother    Hypertension Father    Heart disease Father    Prostate cancer Father    Breast cancer Paternal Aunt        Age 34   Prostate cancer Brother    Colon cancer Paternal Aunt     Review of Systems  Genitourinary:  Positive for vaginal pain (vaginal burning).  All other systems reviewed and are negative.  Exam:   BP 128/60    Pulse 64    Ht 5\' 5"  (1.651 m)    Wt 198 lb (89.8 kg)    LMP 03/04/2015    SpO2 100%    BMI 32.95 kg/m     General appearance: alert, cooperative and appears stated age Head: normocephalic, without obvious abnormality, atraumatic Neck: no adenopathy, supple, symmetrical, trachea midline and thyroid normal to inspection and palpation Lungs: clear to auscultation bilaterally Breasts:  consistent with reduction,  no masses or tenderness, No nipple retraction or dimpling, No nipple discharge or bleeding, No axillary adenopathy Heart: regular rate and rhythm Abdomen: soft, non-tender; no masses, no organomegaly Extremities: extremities normal, atraumatic, no cyanosis or edema Skin: skin color, texture, turgor normal. No rashes or lesions Lymph nodes: cervical, supraclavicular, and axillary nodes normal. Neurologic: grossly normal  Pelvic: External genitalia:  no lesions              No abnormal inguinal nodes palpated.              Urethra:  normal appearing urethra with no masses, tenderness or lesions              Bartholins and Skenes: normal                 Vagina: normal appearing vagina with normal color and discharge, no lesions              Cervix: absent              Pap taken: yes Bimanual Exam:  Uterus:  normal size, contour, position, consistency, mobility, non-tender              Adnexa: no mass, fullness, tenderness              Rectal exam: yes.  Confirms.              Anus:  normal sphincter tone, no lesions  Chaperone was present for exam:  Estill Bamberg, CMA  Assessment:   Well woman visit with gynecologic exam. Status post TVH for CIN III. Hx ASCUS paps.  Vulvovaginitis.  Hx breast reduction.  Osteoporosis screening  Plan: Mammogram screening discussed. Self breast awareness reviewed. Pap and HR HPV as above. Guidelines for Calcium, Vitamin D, regular exercise program including cardiovascular and weight bearing exercise. Nuswab sent for vaginitis testing.  If testing is negative, consider vaginal estrogen treatment.   Schedule bone density at this office.  Follow up annually and prn.   After visit summary provided.

## 2021-01-06 ENCOUNTER — Other Ambulatory Visit: Payer: Self-pay

## 2021-01-06 ENCOUNTER — Ambulatory Visit (INDEPENDENT_AMBULATORY_CARE_PROVIDER_SITE_OTHER): Payer: BC Managed Care – PPO | Admitting: Obstetrics and Gynecology

## 2021-01-06 ENCOUNTER — Encounter: Payer: Self-pay | Admitting: Obstetrics and Gynecology

## 2021-01-06 ENCOUNTER — Other Ambulatory Visit (HOSPITAL_COMMUNITY)
Admission: RE | Admit: 2021-01-06 | Discharge: 2021-01-06 | Disposition: A | Discharge status: Home / Self Care | Payer: BC Managed Care – PPO | Source: Ambulatory Visit | Attending: Obstetrics and Gynecology | Admitting: Obstetrics and Gynecology

## 2021-01-06 VITALS — BP 128/60 | HR 64 | Ht 65.0 in | Wt 198.0 lb

## 2021-01-06 DIAGNOSIS — N760 Acute vaginitis: Secondary | ICD-10-CM | POA: Diagnosis not present

## 2021-01-06 DIAGNOSIS — Z87410 Personal history of cervical dysplasia: Secondary | ICD-10-CM

## 2021-01-06 DIAGNOSIS — Z13820 Encounter for screening for osteoporosis: Secondary | ICD-10-CM | POA: Diagnosis not present

## 2021-01-06 DIAGNOSIS — Z01419 Encounter for gynecological examination (general) (routine) without abnormal findings: Secondary | ICD-10-CM | POA: Diagnosis not present

## 2021-01-06 NOTE — Patient Instructions (Signed)

## 2021-01-07 ENCOUNTER — Other Ambulatory Visit: Payer: Self-pay | Admitting: *Deleted

## 2021-01-07 ENCOUNTER — Telehealth: Payer: Self-pay | Admitting: *Deleted

## 2021-01-07 DIAGNOSIS — Z13820 Encounter for screening for osteoporosis: Secondary | ICD-10-CM

## 2021-01-07 LAB — CERVICOVAGINAL ANCILLARY ONLY
Bacterial Vaginitis (gardnerella): NEGATIVE
Candida Glabrata: NEGATIVE
Candida Vaginitis: NEGATIVE
Comment: NEGATIVE
Comment: NEGATIVE
Comment: NEGATIVE
Comment: NEGATIVE
Trichomonas: NEGATIVE

## 2021-01-07 NOTE — Telephone Encounter (Signed)
Patient informed with vaginal swab testing from office visit on 01/06/21

## 2021-01-11 ENCOUNTER — Other Ambulatory Visit: Payer: Self-pay

## 2021-01-11 DIAGNOSIS — R87610 Atypical squamous cells of undetermined significance on cytologic smear of cervix (ASC-US): Secondary | ICD-10-CM

## 2021-01-11 LAB — CYTOLOGY - PAP
Comment: NEGATIVE
Diagnosis: UNDETERMINED — AB
High risk HPV: NEGATIVE

## 2021-01-19 ENCOUNTER — Encounter: Payer: Self-pay | Admitting: Obstetrics and Gynecology

## 2021-01-19 ENCOUNTER — Other Ambulatory Visit: Payer: Self-pay

## 2021-01-19 ENCOUNTER — Ambulatory Visit: Payer: BC Managed Care – PPO | Admitting: Obstetrics and Gynecology

## 2021-01-19 ENCOUNTER — Other Ambulatory Visit (HOSPITAL_COMMUNITY)
Admission: RE | Admit: 2021-01-19 | Discharge: 2021-01-19 | Disposition: A | Discharge status: Home / Self Care | Payer: BC Managed Care – PPO | Source: Ambulatory Visit | Attending: Obstetrics and Gynecology | Admitting: Obstetrics and Gynecology

## 2021-01-19 DIAGNOSIS — R87610 Atypical squamous cells of undetermined significance on cytologic smear of cervix (ASC-US): Secondary | ICD-10-CM

## 2021-01-19 DIAGNOSIS — N952 Postmenopausal atrophic vaginitis: Secondary | ICD-10-CM

## 2021-01-19 DIAGNOSIS — Z86001 Personal history of in-situ neoplasm of cervix uteri: Secondary | ICD-10-CM

## 2021-01-19 NOTE — Progress Notes (Signed)
°  Subjective:     Patient ID: Tina [name], female   DOB: 06-27-1955, 66 y.o.   MRN: [id number]  HPI Patient here today for colposcopy with pap 01-06-21 ASCUS:Neg HR HPV.  At her visit on 01/06/21, she report vaginal burning and dryness.  She had negative vaginitis testing.   PAP HISTORY; 10-24-19 ASCUS:Neg HR HPV, 09-27-18 ASCUS:Neg HR HPV, 03-30-16 Neg History of abnormal Pap:  yes, 10-24-19 ASCUS:Neg HR HPV, 09-27-18 ASCUS:Neg HR HPV, 2007 CIN III, 2006 Hx cone bx   Review of Systems  All other systems reviewed and are negative. LMP: Hyst for CIN III       Objective:   Physical Exam  Colposcopy Consent for procedure.  Atrophy noted with placement of the speculum.  Cervix absent. 5% acetic acid solution placed in vagina.  No lesions of the vaginal noted.  Lugol's placed and some decreased uptake at the apex.   Examination difficulty for the patient to tolerate due to discomfort.  Biopsy of the apex performed with Tischler.  No complications.  Minimal EBL.    Assessment:     Hx CIN III.  Multiple ASCUS paps.  Neg HR HPV. Vaginal atrophy.     Plan:     Fu biopsy results.  Vaginal atrophy reviewed.  We discussed vaginal estrogen cream use after biopsy result is back.  We did review potential effect on breast cancer.   In addition to the colposcopy, we discussed atrophy and created a plan of care for this.  10 min  total time was spent for this patient encounter, including preparation, face-to-face counseling with the patient, coordination of care, and documentation of the encounter.

## 2021-01-19 NOTE — Patient Instructions (Signed)
Atrophic Vaginitis Atrophic vaginitis is a condition in which the tissues that line the vagina become dry and thin. This condition is most common in women who have stopped having regular menstrual periods (are in menopause). This usually starts when a woman is 58 to 65 years old. That is the time when a woman's estrogen levels begin to decrease. Estrogen is a female hormone. It helps to keep the tissues of the vagina moist. It stimulates the vagina to produce a clear fluid that lubricates the vagina for sex. This fluid also protects the vagina from infection. Lack of estrogen can cause the lining of the vagina to get thinner and dryer. The vagina may also shrink in size. It may become less elastic. Atrophic vaginitis tends to get worse over time as a woman's estrogen level drops. What are the causes? This condition is caused by the normal drop in estrogen that happens around the time of menopause. What increases the risk? Certain conditions or situations may lower a woman's estrogen level, leading to a higher risk for atrophic vaginitis. You are more likely to develop this condition if: You are taking medicines that block estrogen. You have had your ovaries removed. You are being treated for cancer with radiation or medicines (chemotherapy). You have given birth or are breastfeeding. You are older than age 30. You smoke. What are the signs or symptoms? Symptoms of this condition include: Pain, soreness, a feeling of pressure, or bleeding during sex (dyspareunia). Vaginal burning, irritation, or itching. Pain or bleeding when a speculum is used in a vaginal exam. Having burning pain while urinating. Vaginal discharge. In some cases, there are no symptoms. How is this diagnosed? This condition is diagnosed based on your medical history and a physical exam. This will include a pelvic exam that checks the vaginal tissues. Though rare, you may also have other tests, including: A urine test. A test  that checks the acid balance in your vagina (acid balance test). How is this treated? Treatment for this condition depends on how severe your symptoms are. Treatment may include: Using an over-the-counter vaginal lubricant before sex. Using a long-acting vaginal moisturizer. Using low-dose estrogen for moderate to severe symptoms that do not respond to other treatments. Options include creams, tablets, and inserts (vaginal rings). Before you use a vaginal estrogen, tell your health care provider if you have a history of: Breast cancer. Endometrial cancer. Blood clots. If you are not sexually active and your symptoms are very mild, you may not need treatment. Follow these instructions at home: Medicines Take over-the-counter and prescription medicines only as told by your health care provider. Do not use herbal or alternative medicines unless your health care provider says that you can. Use over-the-counter creams, lubricants, or moisturizers for dryness only as told by your health care provider. General instructions If your atrophic vaginitis is caused by menopause, discuss all of your menopause symptoms and treatment options with your health care provider. Do not douche. Do not use products that can make your vagina dry. These include: Scented feminine sprays. Scented tampons. Scented soaps. Vaginal sex can help to improve blood flow and elasticity of vaginal tissue. If you choose to have sex and it hurts, try using a water-soluble lubricant or moisturizer right before having sex. Contact a health care provider if: Your discharge looks different than normal. Your vagina has an unusual smell. You have new symptoms. Your symptoms do not improve with treatment. Your symptoms get worse. Summary Atrophic vaginitis is a condition in  which the tissues that line the vagina become dry and thin. It is most common in women who have stopped having regular menstrual periods (are in  menopause). Treatment options include using vaginal lubricants and low-dose vaginal estrogen. Contact a health care provider if your vagina has an unusual smell, or if your symptoms get worse or do not improve after treatment. This information is not intended to replace advice given to you by your health care provider. Make sure you discuss any questions you have with your health care provider. Document Revised: 06/19/2019 Document Reviewed: 06/19/2019 Elsevier Patient Education  [place] [employer] The following information offers guidance on how to care for yourself after your procedure. Your doctor may also give you more specific instructions. If you have problems or questions, contact your doctor. What can I expect after the procedure? If you did not have a sample of your tissue taken out (did not have a biopsy), you may only have some spotting of blood for a few days. You can go back to your normal activities. If you had a sample of your tissue taken out, it is common to have: Soreness and mild pain. These may last for a few days. Mild bleeding or fluid (discharge) coming from your vagina. The fluid will look dark and grainy. You may have this for a few days. The fluid may be caused by a liquid that was used during your procedure. You may need to wear a sanitary pad. Spotting of blood for at least 48 hours after the procedure. Follow these instructions at home: Medicines Take over-the-counter and prescription medicines only as told by your doctor. Ask your doctor what over-the-counter pain medicines and prescription medicines you can start taking again. This is very important if you take blood thinners. Activity For at least 3 days, or for as long as told by your doctor, avoid: Douching. Using tampons. Having sex. Return to your normal activities as told by your doctor. Ask your doctor what activities are safe for you. General instructions Ask your doctor if you  may take baths, swim, or use a hot tub. You may take showers. If you use birth control (contraception), keep using it. Keep all follow-up visits. Contact a doctor if: You have a fever or chills. You faint or feel light-headed. Get help right away if: You bleed a lot from your vagina. A lot of bleeding means that the bleeding soaks through a pad in less than 1 hour. You have clumps of blood (blood clots) coming from your vagina. You have signs that could mean you have an infection. This may be fluid coming from your vagina that is: Different than normal. Yellow. Bad-smelling. You have very bad pain or cramps in your lower belly that do not get better with medicine. Summary If you did not have a sample of your tissue taken out, you may only have some spotting of blood for a few days. You can go back to your normal activities. If you had a sample of your tissue taken out, it is common to have mild pain for a few days and spotting for 48 hours. Avoid douching, using tampons, and having sex for at least 3 days after the procedure or for as long as told. Get help right away if you have a lot of bleeding, very bad pain, or signs of infection. This information is not intended to replace advice given to you by your health care provider. Make sure you discuss any questions you  have with your health care provider. Document Revised: 05/16/2020 Document Reviewed: 05/16/2020 Elsevier Patient Education  [place].

## 2021-01-21 LAB — SURGICAL PATHOLOGY

## 2021-01-27 ENCOUNTER — Telehealth: Payer: Self-pay | Admitting: *Deleted

## 2021-01-27 ENCOUNTER — Encounter: Payer: Self-pay | Admitting: *Deleted

## 2021-01-27 ENCOUNTER — Telehealth: Payer: Self-pay | Admitting: Obstetrics and Gynecology

## 2021-01-27 MED ORDER — ESTRADIOL 0.1 MG/GM VA CREA: TOPICAL_CREAM | VAGINAL | 1 refills | Status: DC

## 2021-01-27 MED ORDER — ESTRADIOL 0.1 MG/GM VA CREA: TOPICAL_CREAM | VAGINAL | 0 refills | Status: DC

## 2021-01-27 NOTE — Telephone Encounter (Signed)
-----   Message from Nunzio Cobbs, MD sent at 01/27/2021  9:05 AM EST ----- Please let patient know that her colposcopy biopsy of the vagina is benign.  No abnormal cells were seen.   I recommend she start vaginal estrogen cream, which she and I discussed.  She has symptoms of atrophy and atrophy was noted with her colposcopy as well.  Estrace 0.01% cream (generic ok). Place 1/2 gram pv at hs nightly for 2 weeks.  Then place 1/2 gram pv at hs twice weekly.  Disp:  42.5 grams, RF one.

## 2021-01-27 NOTE — Telephone Encounter (Signed)
Patient informed, recall placed.

## 2021-01-27 NOTE — Telephone Encounter (Signed)
Please see colposcopy result note for results and recommendation for vaginal estrogen treatment.   Needs pap recall for 12 months.

## 2021-01-27 NOTE — Telephone Encounter (Signed)
Left message in voice mail for patient to call. 

## 2021-01-27 NOTE — Telephone Encounter (Signed)
Patient informed with below note, pap recall in 1 year placed. Rx sent.  Patient asked I could type a note stating she had Gyn exam on 01/06/21. Letter printed and left a front desk for pickup.

## 2021-01-28 NOTE — Telephone Encounter (Signed)
Patient informed with all the below.  

## 2021-01-28 NOTE — Telephone Encounter (Signed)
Atrophy can cause the appearance of cells to be abnormal on a pap.   Low risk HPV can also cause this.   Low risk HPV strains are not screened for on a pap, because they do not cause cancer.    Only high risk HPV strains are screened for on a pap as they have the potential to cause high grade precancer and cancer.   The vaginal estrogen will help to treat atrophy and potentially normalize the cells on her pap smear.   I hope this explains better.

## 2021-01-28 NOTE — Telephone Encounter (Signed)
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 She thought you told her at her colpo appointment you told her that since colpo confirmed atrophy that sometimes this could cause abnormal cells? She asked me to double check with you to see if she understood you correctly. Please advise

## 2021-04-08 ENCOUNTER — Other Ambulatory Visit: Payer: Self-pay | Admitting: Internal Medicine

## 2021-04-08 DIAGNOSIS — Z1231 Encounter for screening mammogram for malignant neoplasm of breast: Secondary | ICD-10-CM

## 2021-05-12 ENCOUNTER — Ambulatory Visit
Admission: RE | Admit: 2021-05-12 | Discharge: 2021-05-12 | Disposition: A | Discharge status: Home / Self Care | Payer: BC Managed Care – PPO | Source: Ambulatory Visit | Attending: Internal Medicine | Admitting: Internal Medicine

## 2021-05-12 ENCOUNTER — Ambulatory Visit: Payer: BC Managed Care – PPO

## 2021-05-12 DIAGNOSIS — Z1231 Encounter for screening mammogram for malignant neoplasm of breast: Secondary | ICD-10-CM

## 2021-11-15 ENCOUNTER — Ambulatory Visit: Payer: Medicare PPO | Admitting: Podiatry

## 2021-12-18 ENCOUNTER — Ambulatory Visit
Admission: RE | Admit: 2021-12-18 | Discharge: 2021-12-18 | Disposition: A | Discharge status: Home / Self Care | Payer: Medicare PPO | Source: Ambulatory Visit | Attending: Family Medicine | Admitting: Family Medicine

## 2021-12-18 VITALS — BP 121/87 | HR 110 | Temp 99.2°F | Resp 18

## 2021-12-18 DIAGNOSIS — J069 Acute upper respiratory infection, unspecified: Secondary | ICD-10-CM | POA: Diagnosis not present

## 2021-12-18 DIAGNOSIS — U071 COVID-19: Secondary | ICD-10-CM | POA: Insufficient documentation

## 2021-12-18 LAB — RESP PANEL BY RT-PCR (FLU A&B, COVID) ARPGX2
Influenza A by PCR: NEGATIVE
Influenza B by PCR: NEGATIVE
SARS Coronavirus 2 by RT PCR: POSITIVE — AB

## 2021-12-18 MED ORDER — NIRMATRELVIR/RITONAVIR (PAXLOVID)TABLET: 3.0000 | ORAL_TABLET | Freq: Two times a day (BID) | ORAL | 0 refills | Status: AC

## 2021-12-18 MED ORDER — FLUTICASONE PROPIONATE 50 MCG/ACT NA SUSP: 1.0000 | Freq: Two times a day (BID) | NASAL | 2 refills | Status: AC

## 2021-12-18 MED ORDER — PROMETHAZINE-DM 6.25-15 MG/5ML PO SYRP: 5.0000 mL | ORAL_SOLUTION | Freq: Four times a day (QID) | ORAL | 0 refills | Status: DC | PRN

## 2021-12-18 NOTE — ED Provider Notes (Signed)
[facility]    CSN: [id number] Arrival date & time: 12/18/21  0946      History   Chief Complaint Chief Complaint  Patient presents with   Cough    Entered by patient   Headache   Generalized Body Aches   Chills    HPI Tina [name] is a 66 y.o. female.   Patient presenting today with 40 history of body aches, chills, cough, headache, congestion, scratchy throat, fatigue.  Denies chest pain, shortness of breath, abdominal pain, nausea vomiting or diarrhea.  So far trying numerous over-the-counter cold congestion medications, Tylenol with minimal relief.  Has been tested positive for COVID and is currently on Paxlovid, she is requesting the same.  No known history of chronic pulmonary disease.    Past Medical History:  Diagnosis Date   Anxiety    Arthritis    ASCUS of cervix with negative high risk HPV 09/2018   CIN III (cervical intraepithelial neoplasia III) 2007   Hysterectomy    Patient Active Problem List   Diagnosis Date Noted   Encounter for screening colonoscopy 08/10/2011   Constipation 08/10/2011   CIN III (cervical intraepithelial neoplasia III)    CONJUNCTIVITIS NOS 08/29/2006   ALLERGIC RHINITIS 08/29/2006   ANEMIA-NOS 08/28/2006    Past Surgical History:  Procedure Laterality Date   ABDOMINAL HYSTERECTOMY     bleeding, fibroids   BREAST SURGERY  2015   Reduction   BUNIONECTOMY     CERVICAL CONE BIOPSY  2006   COLONOSCOPY  08/17/2011   Procedure: COLONOSCOPY;  Surgeon: Daneil Dolin, MD;  Location: [facility];  Service: Endoscopy;  Laterality: N/A;  7:30   COLONOSCOPY N/A 06/11/2019   Procedure: COLONOSCOPY;  Surgeon: Daneil Dolin, MD;  Location: [facility];  Service: Endoscopy;  Laterality: N/A;  10:30   REDUCTION MAMMAPLASTY Bilateral     OB History     Gravida  1   Para      Term      Preterm      AB  1   Living  0      SAB      IAB      Ectopic      Multiple      Live Births                Home Medications    Prior to Admission medications   Medication Sig Start Date End Date Taking? Authorizing Provider  acetaminophen (TYLENOL) 500 MG tablet Take 500-1,000 mg by mouth every 6 (six) hours as needed (PAIN.).   Yes [provider]  ascorbic acid (VITAMIN C) 500 MG tablet Take 500 mg by mouth daily after lunch.   Yes [provider]  Cholecalciferol (VITAMIN D3) 50 MCG (2000 UT) TABS Take 2,000 Units by mouth daily after lunch.   Yes [provider]  clindamycin (CLEOCIN T) 1 % lotion Apply 1 application topically daily.    Yes [provider]  Fexofenadine HCl (ALLEGRA PO) Take by mouth.   Yes [provider]  fluticasone (FLONASE) 50 MCG/ACT nasal spray Place 1 spray into both nostrils 2 (two) times daily. 12/18/21  Yes Volney American, PA-C  hydroxypropyl methylcellulose (ISOPTO TEARS) 2.5 % ophthalmic solution Place 1 drop into both eyes 4 (four) times daily as needed (dry/irritated eyes.).    Yes [provider]  levocetirizine (XYZAL) 2.5 MG/5ML solution Take 2.5 mg by mouth every evening.   Yes [provider]  MEGARED OMEGA-3 KRILL OIL PO Take 500 mg by mouth daily after lunch.   Yes [provider]  melatonin 5 MG TABS Take 5 mg by mouth at bedtime.   Yes [provider]  Multiple Vitamin (MULTIVITAMIN WITH MINERALS) TABS tablet Take 1 tablet by mouth daily after lunch.   Yes [provider]  nirmatrelvir/ritonavir EUA (PAXLOVID) 20 x 150 MG & 10 x '100MG'$  TABS Take 3 tablets by mouth 2 (two) times daily for 5 days. Patient GFR is unavailable - not calculated in most recent CMPs but does have all normal serum creatinine levels. Take nirmatrelvir (150 mg) two tablets twice daily for 5 days and ritonavir (100 mg) one tablet twice daily for 5 days. 12/18/21 12/23/21 Yes Volney American, PA-C  NON FORMULARY Green Vibrance Powder Take 1 scoop daily   Yes [provider]  promethazine-dextromethorphan (PROMETHAZINE-DM) 6.25-15 MG/5ML syrup Take 5 mLs by mouth 4 (four) times daily as needed. 12/18/21  Yes Volney American, PA-C  saw palmetto 160 MG capsule Take 160 mg by mouth 2 (two) times daily.   Yes [provider]  TURMERIC PO Take 2,000 mg by mouth daily after lunch. 1000 mg/per capsule   Yes [provider]  estradiol (ESTRACE VAGINAL) 0.1 MG/GM vaginal cream Insert 1/2 gram vaginally at bedtime for 2 weeks. After 2 weeks insert 1/2 gram vaginally at bedtime nightly twice a week. 01/27/21   Nunzio Cobbs, MD  GARLIC PO Take 2 capsules by mouth daily after lunch. Aged Garlic Extract    [provider]    Family History Family History  Problem Relation Age of Onset   Stomach cancer Mother    Hypertension Father    Heart disease Father    Prostate cancer Father    Breast cancer Paternal Aunt        Age 21   Prostate cancer Brother    Colon cancer Paternal Aunt     Social History Social History   Tobacco Use   Smoking status: Former   Smokeless tobacco: Never  Scientific laboratory technician Use: Never used  Substance Use Topics   Alcohol use: Not Currently    Comment: occasional--2 drinks/month   Drug use: No     Allergies   Shellfish allergy, Ciprofloxacin, Macrobid [nitrofurantoin monohydrate macrocrystals], and Neomycin   Review of Systems Review of Systems Per HPI  Physical Exam Triage Vital Signs ED Triage Vitals  Enc Vitals Group     BP 12/18/21 0952 121/87     Pulse Rate 12/18/21 0956 (!) 110     Resp 12/18/21 0952 18     Temp 12/18/21 0952 99.2 F (37.3 C)     Temp Source 12/18/21 0952 Oral     SpO2 12/18/21 0952 97 %     Weight --      Height --      Head Circumference --      Peak Flow --      Pain Score 12/18/21 0952 0     Pain Loc --      Pain Edu? --      Excl. in [place]? --    No data found.  Updated Vital Signs BP 121/87 (BP Location: Right Arm)   Pulse (!) 110    Temp 99.2 F (37.3 C) (Oral)   Resp 18   LMP 03/04/2015   SpO2 97%   Visual Acuity Right Eye Distance:   Left  Eye Distance:   Bilateral Distance:    Right Eye Near:   Left Eye Near:    Bilateral Near:     Physical Exam Vitals and nursing note reviewed.  Constitutional:      Appearance: Normal appearance.  HENT:     Head: Atraumatic.     Right Ear: Tympanic membrane and external ear normal.     Left Ear: Tympanic membrane and external ear normal.     Nose: Rhinorrhea present.     Mouth/Throat:     Mouth: Mucous membranes are moist.     Pharynx: Posterior oropharyngeal erythema present.  Eyes:     Extraocular Movements: Extraocular movements intact.     Conjunctiva/sclera: Conjunctivae normal.  Cardiovascular:     Rate and Rhythm: Normal rate and regular rhythm.     Heart sounds: Normal heart sounds.  Pulmonary:     Effort: Pulmonary effort is normal.     Breath sounds: Normal breath sounds. No wheezing or rales.  Musculoskeletal:        General: Normal range of motion.     Cervical back: Normal range of motion and neck supple.  Skin:    General: Skin is warm and dry.  Neurological:     Mental Status: She is alert and oriented to person, place, and time.  Psychiatric:        Mood and Affect: Mood normal.        Thought Content: Thought content normal.      UC Treatments / Results  Labs (all labs ordered are listed, but only abnormal results are displayed) Labs Reviewed  RESP PANEL BY RT-PCR (FLU A&B, COVID) [id number]    EKG   Radiology No results found.  Procedures Procedures (including critical care time)  Medications Ordered in UC Medications - No data to display  Initial Impression / Assessment and Plan / UC Course  I have reviewed the triage vital signs and the nursing notes.  Pertinent labs & imaging results that were available during my care of the patient were reviewed by me and considered in my medical decision making (see chart for  details).     Respiratory panel pending though given home exposure to COVID and consistent symptoms suspect COVID-19.  Treat with Paxlovid, Phenergan DM, Flonase, other supportive home care.  Return for worsening symptoms.  Final Clinical Impressions(s) / UC Diagnoses   Final diagnoses:  Viral URI with cough   Discharge Instructions   None    ED Prescriptions     Medication Sig Dispense Auth. Provider   nirmatrelvir/ritonavir EUA (PAXLOVID) 20 x 150 MG & 10 x '100MG'$  TABS Take 3 tablets by mouth 2 (two) times daily for 5 days. Patient GFR is unavailable - not calculated in most recent CMPs but does have all normal serum creatinine levels. Take nirmatrelvir (150 mg) two tablets twice daily for 5 days and ritonavir (100 mg) one tablet twice daily for 5 days. 30 tablet Volney American, [place]   promethazine-dextromethorphan (PROMETHAZINE-DM) 6.25-15 MG/5ML syrup Take 5 mLs by mouth 4 (four) times daily as needed. 100 mL Volney American, PA-C   fluticasone [facility]) 50 MCG/ACT nasal spray Place 1 spray into both nostrils 2 (two) times daily. 16 g Volney American, [place]      PDMP not reviewed this encounter.   Volney American, [place] 12/18/21 1015

## 2021-12-18 NOTE — ED Triage Notes (Signed)
Body aches, chills, cough, headache that started Thursday. Friday husband was positive for COVID. Taking OTC cold medication, mucinex, dyquil to try and help.

## 2022-01-31 NOTE — Progress Notes (Signed)
67 y.o. G69P0010 Married [place] American female here for annual exam.    Hx recurrent ASCUS paps.   Colpo with vaginal biopsy 01/19/21 - negative for SIL.  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   She temporarily used vaginal estrogen cream, but stopped when she ran out.   PCP:   Shon Baton, MD        Sexually active: Yes.    The current method of family planning is status post hysterectomy.    Exercising: Yes.     Weight lifiting 4x a week, elipitical Smoker:  no  Health Maintenance: Pap:  01/06/21 ASCUS: HR HPV neg, 10/24/19 ASCUS:Neg HR HPV, 09-27-18 ASCUS:Neg HR HPV, 03-30-16 Neg  History of abnormal Pap:  yes, 10-24-19 ASCUS:Neg HR HPV, 09-27-18 ASCUS:Neg HR HPV, 2007 CIN III, 2006 Hx cone bx  MMG:  05/12/21 Breast Density Category B, BI-RADS CATEGORY 1 Neg Colonoscopy:  06/11/19 - due in 2031. BMD:   07/21/09  Result  normal TDaP:  PCP Gardasil:   no Screening Labs: PCP   reports that she has quit smoking. She has never used smokeless tobacco. She reports that she does not currently use alcohol. She reports that she does not use drugs.  Past Medical History:  Diagnosis Date   Anxiety    Arthritis    ASCUS of cervix with negative high risk HPV 09/2018   CIN III (cervical intraepithelial neoplasia III) 2007   Hysterectomy    Past Surgical History:  Procedure Laterality Date   ABDOMINAL HYSTERECTOMY     bleeding, fibroids   BREAST SURGERY  2015   Reduction   BUNIONECTOMY     CERVICAL CONE BIOPSY  2006   COLONOSCOPY  08/17/2011   Procedure: COLONOSCOPY;  Surgeon: Daneil Dolin, MD;  [facility];  Service: Endoscopy;  Laterality: N/A;  7:30   COLONOSCOPY N/A 06/11/2019   Procedure: COLONOSCOPY;  Surgeon: Daneil Dolin, MD;  [facility];  Service: Endoscopy;  Laterality: N/A;  10:30   REDUCTION MAMMAPLASTY Bilateral     Current Outpatient Medications  Medication Sig Dispense Refill   acetaminophen (TYLENOL) 500 MG tablet Take 500-1,000 mg by mouth  every 6 (six) hours as needed (PAIN.).     Cholecalciferol (VITAMIN D3) 50 MCG (2000 UT) TABS Take 2,000 Units by mouth daily after lunch.     clindamycin (CLEOCIN T) 1 % lotion Apply 1 application topically daily.      estradiol (ESTRACE VAGINAL) 0.1 MG/GM vaginal cream Insert 1/2 gram vaginally at bedtime for 2 weeks. After 2 weeks insert 1/2 gram vaginally at bedtime nightly twice a week. 42.5 g 1   Fexofenadine HCl (ALLEGRA PO) Take by mouth.     fluticasone (FLONASE) 50 MCG/ACT nasal spray Place 1 spray into both nostrils 2 (two) times daily. 16 g 2   GARLIC PO Take 2 capsules by mouth daily after lunch. Aged Garlic Extract     hydroxypropyl methylcellulose (ISOPTO TEARS) 2.5 % ophthalmic solution Place 1 drop into both eyes 4 (four) times daily as needed (dry/irritated eyes.).      levocetirizine (XYZAL) 2.5 MG/5ML solution Take 2.5 mg by mouth every evening.     MEGARED OMEGA-3 KRILL OIL PO Take 500 mg by mouth daily after lunch.     melatonin 5 MG TABS Take 5 mg by mouth at bedtime.     Multiple Vitamin (MULTIVITAMIN WITH MINERALS) TABS tablet Take 1 tablet by mouth daily after lunch.     NON FORMULARY  Green Vibrance Powder Take 1 scoop daily     promethazine-dextromethorphan (PROMETHAZINE-DM) 6.25-15 MG/5ML syrup Take 5 mLs by mouth 4 (four) times daily as needed. 100 mL 0   saw palmetto 160 MG capsule Take 160 mg by mouth 2 (two) times daily.     TURMERIC PO Take 2,000 mg by mouth daily after lunch. 1000 mg/per capsule     ascorbic acid (VITAMIN C) 500 MG tablet Take 500 mg by mouth daily after lunch. (Patient not taking: Reported on 02/14/2022)     No current facility-administered medications for this visit.    Family History  Problem Relation Age of Onset   Stomach cancer Mother    Hypertension Father    Heart disease Father    Prostate cancer Father    Breast cancer Paternal 74        Age 31   Prostate cancer Brother    Colon cancer Paternal Aunt     Review of Systems   All other systems reviewed and are negative.   Exam:   BP 116/78 (BP Location: Left Arm, Patient Position: Sitting, Cuff Size: Large)   Pulse (!) 41   Ht 5' 5"$  (1.651 m)   Wt 199 lb (90.3 kg)   LMP 03/04/2015   SpO2 99%   BMI 33.12 kg/m     General appearance: alert, cooperative and appears stated age Head: normocephalic, without obvious abnormality, atraumatic Neck: no adenopathy, supple, symmetrical, trachea midline and thyroid normal to inspection and palpation Lungs: clear to auscultation bilaterally Breasts: normal appearance, no masses or tenderness, No nipple retraction or dimpling, No nipple discharge or bleeding, No axillary adenopathy Heart: regular rate and rhythm Abdomen: soft, non-tender; no masses, no organomegaly Extremities: extremities normal, atraumatic, no cyanosis or edema Skin: skin color, texture, turgor normal. No rashes or lesions Lymph nodes: cervical, supraclavicular, and axillary nodes normal. Neurologic: grossly normal  Pelvic: External genitalia:  no lesions              No abnormal inguinal nodes palpated.              Urethra:  normal appearing urethra with no masses, tenderness or lesions              Bartholins and Skenes: normal                 Vagina: normal appearing vagina with normal color and discharge, no lesions              Cervix: absent              Pap taken: yes Bimanual Exam:  Uterus:  absent              Adnexa: no mass, fullness, tenderness              Rectal exam:  declined.  Chaperone was present for exam:  Raquel Sarna.  Assessment:   Well woman visit with gynecologic exam. GYN exam for high risk Medicare patient.  Status post TVH for CIN III. Hx recurrent ASCUS paps.  Vaginal atrophy. Hx breast reduction.  Menopausal female. Osteoporosis screening.  Plan: Mammogram screening discussed. Self breast awareness reviewed. Pap and HR HPV collected.  Guidelines for Calcium, Vitamin D, regular exercise program including  cardiovascular and weight bearing exercise. Rx for vaginal estradiol cream.  Instructed in used 1/2 gram pv at hs 2 weeks and then 1/2 gram pv at hs 2 times per week.  (She was instructed that she can use  the cream less often, for example once per week for maintenance dosing.)  Benefits and risks reviewed.  I discussed potential effect on breast cancer.  BMD at the [place].  Follow up annually and prn.   20 min  total time was spent for this patient encounter, including preparation, face-to-face counseling with the patient, coordination of care, and documentation of the encounter in addition to doing breast/pelvic/pap.   After visit summary provided.

## 2022-02-14 ENCOUNTER — Ambulatory Visit (INDEPENDENT_AMBULATORY_CARE_PROVIDER_SITE_OTHER): Payer: Medicare PPO | Admitting: Obstetrics and Gynecology

## 2022-02-14 ENCOUNTER — Other Ambulatory Visit (HOSPITAL_COMMUNITY)
Admission: RE | Admit: 2022-02-14 | Discharge: 2022-02-14 | Disposition: A | Discharge status: Home / Self Care | Payer: Medicare PPO | Source: Ambulatory Visit | Attending: Obstetrics and Gynecology | Admitting: Obstetrics and Gynecology

## 2022-02-14 ENCOUNTER — Encounter: Payer: Self-pay | Admitting: Obstetrics and Gynecology

## 2022-02-14 VITALS — BP 116/78 | HR 41 | Ht 65.0 in | Wt 199.0 lb

## 2022-02-14 DIAGNOSIS — Z01419 Encounter for gynecological examination (general) (routine) without abnormal findings: Secondary | ICD-10-CM | POA: Diagnosis not present

## 2022-02-14 DIAGNOSIS — Z1151 Encounter for screening for human papillomavirus (HPV): Secondary | ICD-10-CM | POA: Insufficient documentation

## 2022-02-14 DIAGNOSIS — Z124 Encounter for screening for malignant neoplasm of cervix: Secondary | ICD-10-CM

## 2022-02-14 DIAGNOSIS — Z780 Asymptomatic menopausal state: Secondary | ICD-10-CM

## 2022-02-14 DIAGNOSIS — Z1272 Encounter for screening for malignant neoplasm of vagina: Secondary | ICD-10-CM | POA: Insufficient documentation

## 2022-02-14 DIAGNOSIS — N952 Postmenopausal atrophic vaginitis: Secondary | ICD-10-CM

## 2022-02-14 DIAGNOSIS — Z13820 Encounter for screening for osteoporosis: Secondary | ICD-10-CM

## 2022-02-14 DIAGNOSIS — Z9189 Other specified personal risk factors, not elsewhere classified: Secondary | ICD-10-CM | POA: Diagnosis not present

## 2022-02-14 MED ORDER — ESTRADIOL 0.1 MG/GM VA CREA: TOPICAL_CREAM | VAGINAL | 2 refills | Status: DC

## 2022-02-14 NOTE — Patient Instructions (Signed)

## 2022-02-20 LAB — CYTOLOGY - PAP
Comment: NEGATIVE
Diagnosis: NEGATIVE
High risk HPV: NEGATIVE

## 2022-03-23 IMAGING — MG MM DIGITAL SCREENING BILAT W/ TOMO AND CAD
8 series · 8 of 24 positions shown · non-contrast
Comparison: Previous exam(s).

ACR Breast Density Category a: The breast tissue is almost entirely
fatty.

CLINICAL DATA: Screening.

EXAM:
DIGITAL SCREENING BILATERAL MAMMOGRAM WITH TOMOSYNTHESIS AND CAD
TECHNIQUE: Bilateral screening digital craniocaudal and mediolateral oblique
mammograms were obtained. Bilateral screening digital breast
tomosynthesis was performed. The images were evaluated with
computer-aided detection.

[L CC synth-2D]
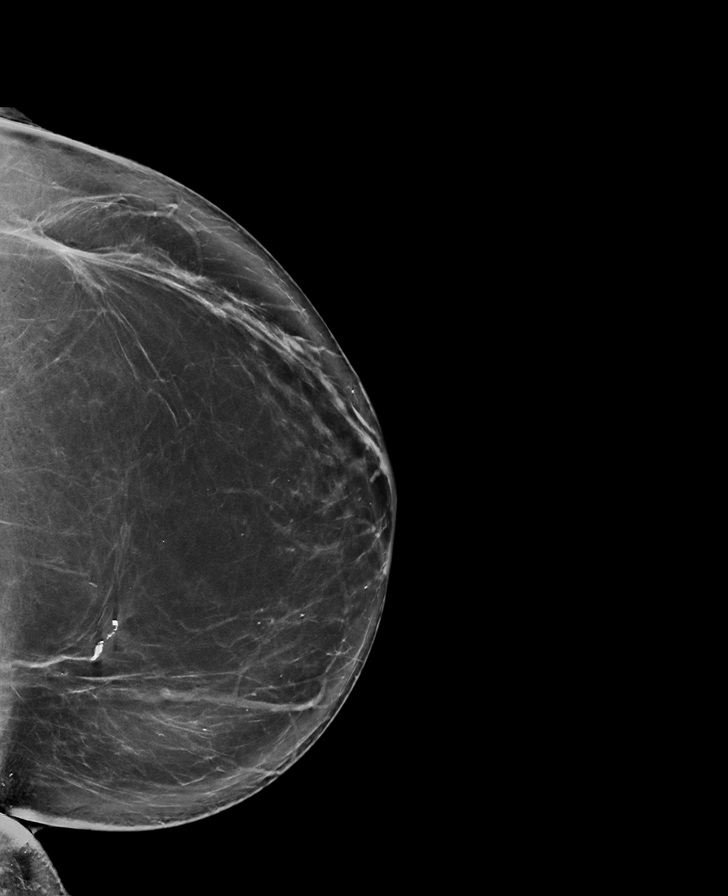

[L MLO synth-2D]
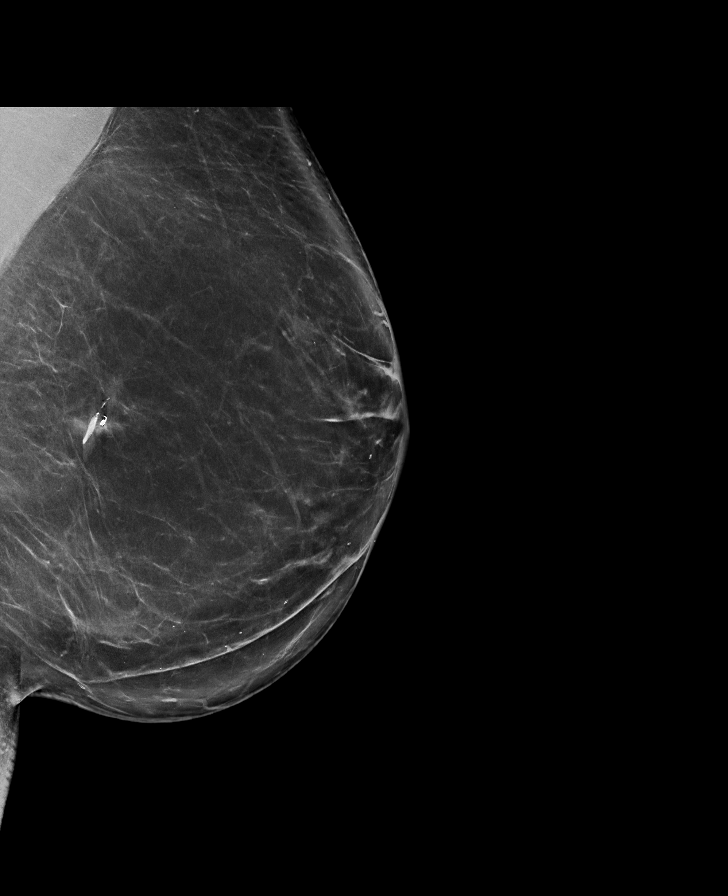

[R MLO synth-2D]
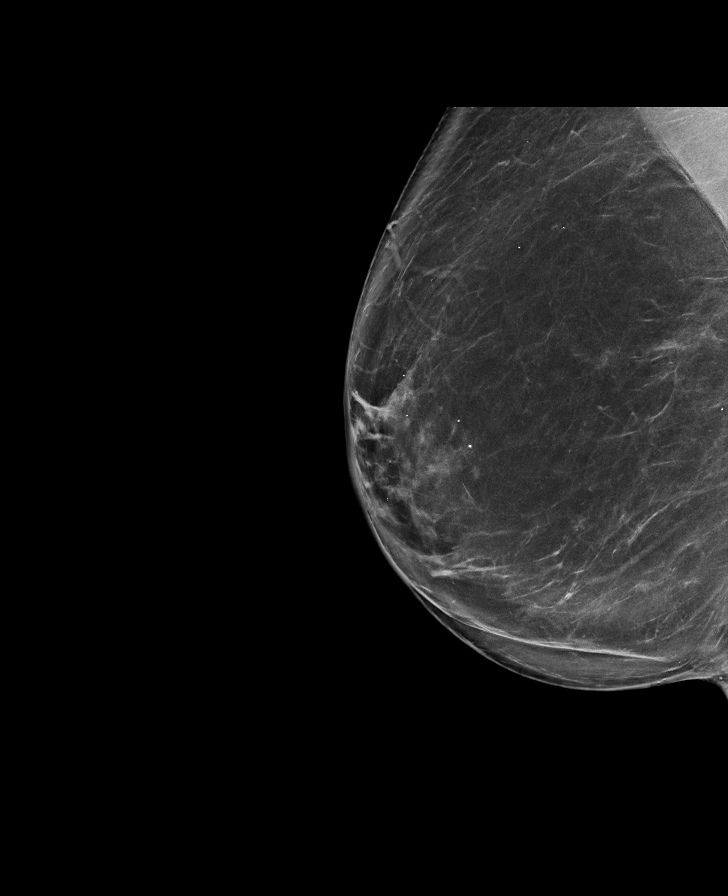

[R CC synth-2D]
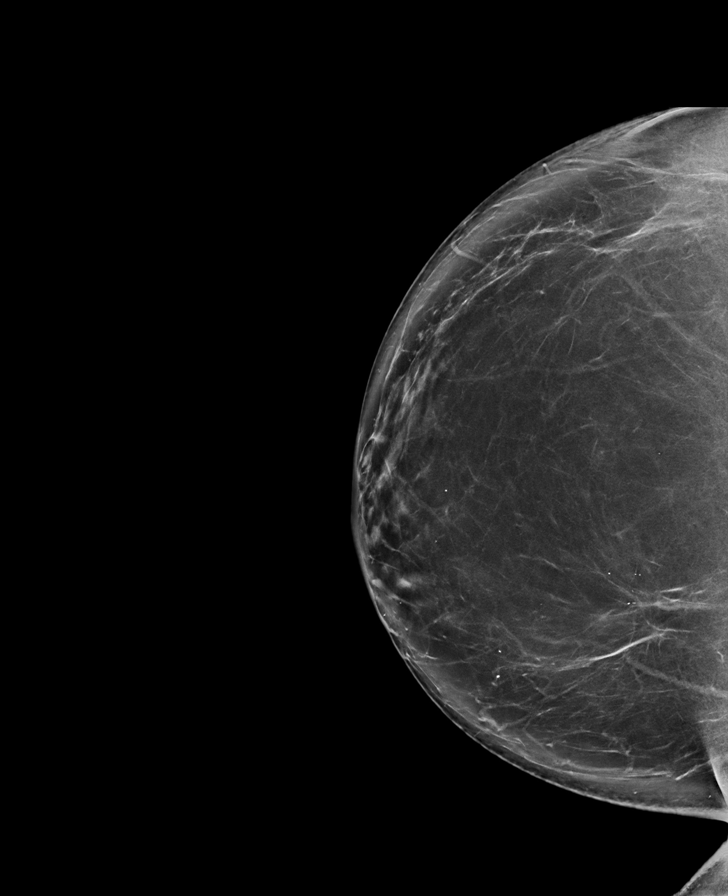

[R MLO tomo · tomo slice 53/104.0]
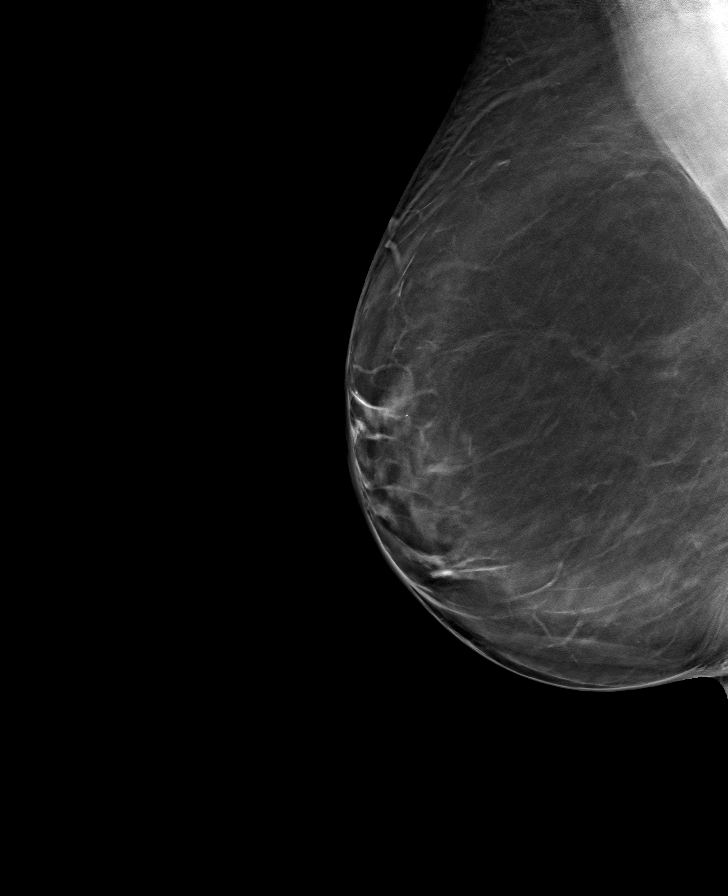

[L CC tomo · tomo slice 49/96.0]
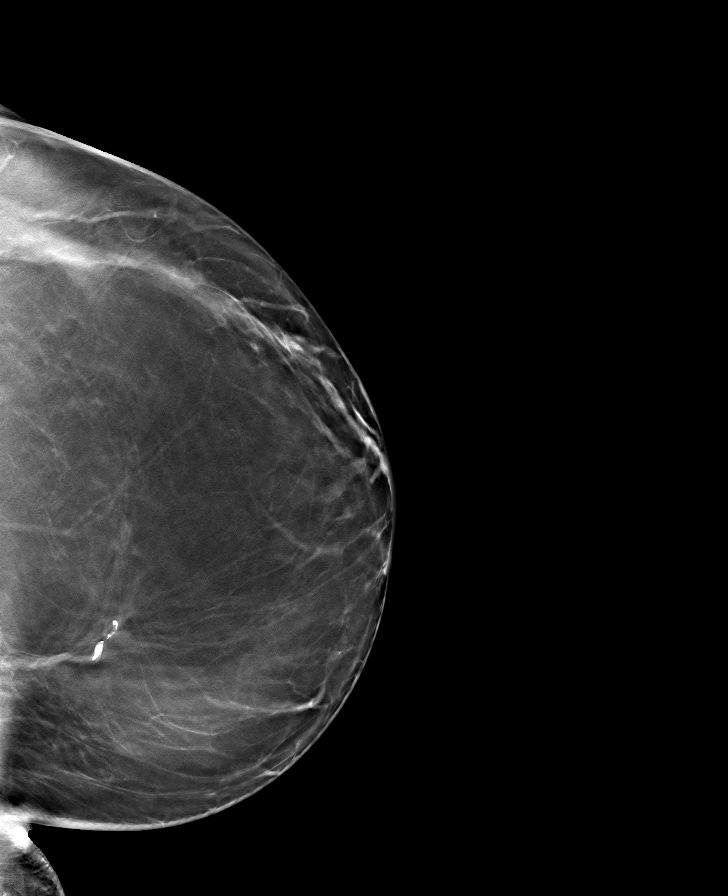

[L MLO tomo · tomo slice 52/103.0]
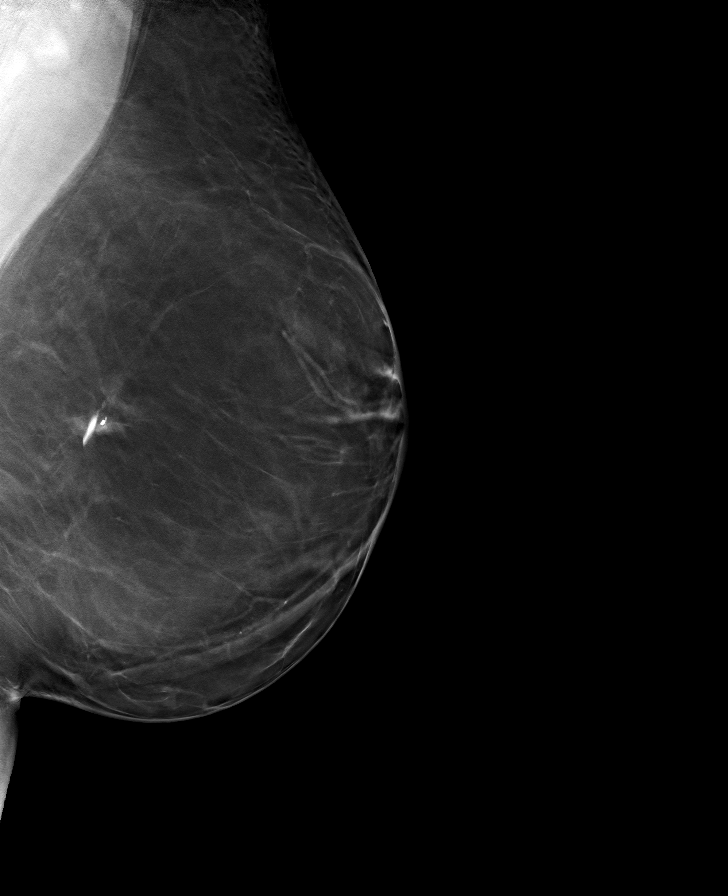

[R CC tomo · tomo slice 49/98.0]
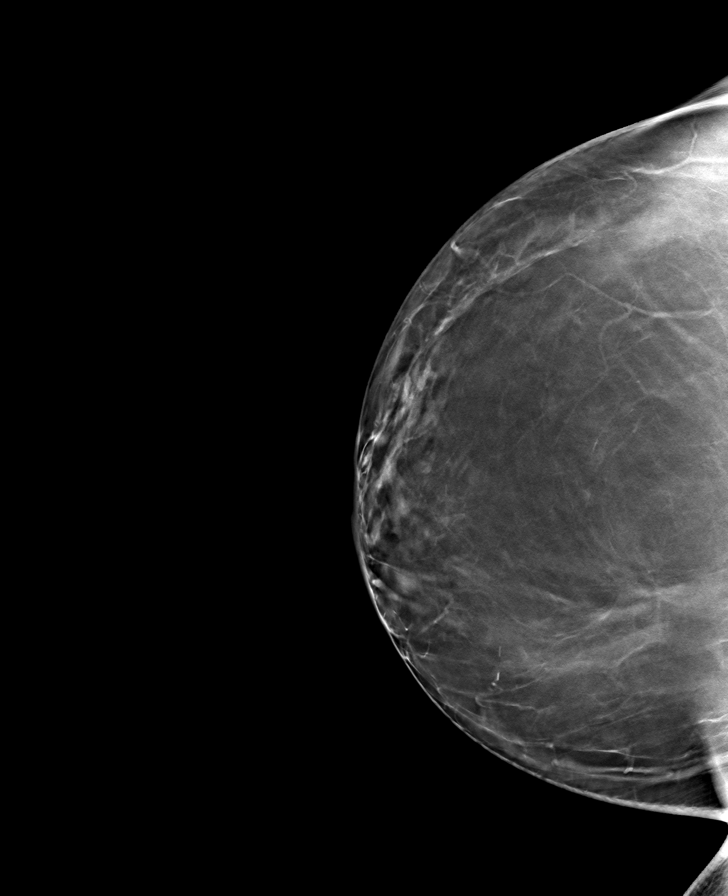

[8 of 24 positions shown; findings below may reference images not displayed]

FINDINGS: There are no findings suspicious for malignancy. The images were
evaluated with computer-aided detection.
IMPRESSION: No mammographic evidence of malignancy. A result letter of this
screening mammogram will be mailed directly to the patient.

RECOMMENDATION:
Screening mammogram in one year. (Code:JP-J-DD5)

BI-RADS CATEGORY  1: Negative.

## 2022-03-29 ENCOUNTER — Other Ambulatory Visit: Payer: Self-pay | Admitting: Internal Medicine

## 2022-03-29 DIAGNOSIS — Z1231 Encounter for screening mammogram for malignant neoplasm of breast: Secondary | ICD-10-CM

## 2022-05-30 ENCOUNTER — Ambulatory Visit
Admission: RE | Admit: 2022-05-30 | Discharge: 2022-05-30 | Disposition: A | Discharge status: Home / Self Care | Payer: Medicare PPO | Source: Ambulatory Visit | Attending: Internal Medicine | Admitting: Internal Medicine

## 2022-05-30 DIAGNOSIS — Z1231 Encounter for screening mammogram for malignant neoplasm of breast: Secondary | ICD-10-CM

## 2022-06-15 ENCOUNTER — Other Ambulatory Visit: Payer: Self-pay | Admitting: Internal Medicine

## 2022-06-15 DIAGNOSIS — E785 Hyperlipidemia, unspecified: Secondary | ICD-10-CM

## 2022-07-17 ENCOUNTER — Other Ambulatory Visit: Payer: Medicare PPO

## 2022-08-01 ENCOUNTER — Other Ambulatory Visit: Payer: Medicare PPO

## 2022-08-04 ENCOUNTER — Other Ambulatory Visit: Payer: Medicare PPO

## 2022-08-07 ENCOUNTER — Ambulatory Visit
Admission: RE | Admit: 2022-08-07 | Discharge: 2022-08-07 | Disposition: A | Discharge status: Home / Self Care | Payer: Medicare PPO | Source: Ambulatory Visit | Attending: Obstetrics and Gynecology | Admitting: Obstetrics and Gynecology

## 2022-08-07 DIAGNOSIS — Z780 Asymptomatic menopausal state: Secondary | ICD-10-CM

## 2022-09-14 DIAGNOSIS — K529 Noninfective gastroenteritis and colitis, unspecified: Secondary | ICD-10-CM | POA: Diagnosis not present

## 2022-09-14 DIAGNOSIS — R195 Other fecal abnormalities: Secondary | ICD-10-CM | POA: Diagnosis not present

## 2022-09-28 ENCOUNTER — Other Ambulatory Visit: Payer: Medicare PPO

## 2022-10-17 ENCOUNTER — Ambulatory Visit (INDEPENDENT_AMBULATORY_CARE_PROVIDER_SITE_OTHER): Payer: Medicare PPO | Admitting: Internal Medicine

## 2022-10-17 ENCOUNTER — Encounter: Payer: Self-pay | Admitting: Internal Medicine

## 2022-10-17 VITALS — BP 126/79 | HR 72 | Temp 97.2°F | Ht 66.0 in | Wt 190.6 lb

## 2022-10-17 DIAGNOSIS — R109 Unspecified abdominal pain: Secondary | ICD-10-CM | POA: Diagnosis not present

## 2022-10-17 NOTE — Patient Instructions (Signed)
It was nice to see you again today!  As discussed, you had self-limiting GI symptoms.  Nonspecific.  May have consumed some bad food which caused a transient gut infection.  I am pleased to see that your symptoms have resolved.  1 more month of daily align is recommended.  I suspect you will be able to add dairy back to your diet as time goes on.  If you are able to get more information about your mothers stomach cancer please let me know.  Plan for colonoscopy for screening 2031.  If you have any recurrent issues please do not hesitate to give me a call

## 2022-10-17 NOTE — Progress Notes (Unsigned)
Primary Care Physician:  Creola Corn, MD Primary Gastroenterologist:  Dr. Jena Gauss  Pre-Procedure History & Physical: HPI:  Tina [name] is a 67 y.o. female here for evaluation of a recent bout of self-limiting abdominal cramps.  Was doing well until her birthday end of [birth year] she felt she may have eaten some bad food had abdominal cramps for several days.  No associated diarrhea nausea vomiting or fever.  PCP who evaluated her advised probiotic.  She took [employer]' colon health for short period of time felt it constipated her; went to align 1 capsule daily.  Symptoms have resolved.  She is having regular bowel function no abdominal pain no bleeding daughter in 2021 for an average of average risk screening colonoscopy which was normal.  States she may have a little lactose intolerance since this episode she is avoiding dairy for the time being.  She eats well and goes to the gym often.  She gives a somewhat sketchy history of her mother with "stomach cancer".  Past Medical History:  Diagnosis Date   Anxiety    Arthritis    ASCUS of cervix with negative high risk HPV 09/2018   CIN III (cervical intraepithelial neoplasia III) 2007   Hysterectomy    Past Surgical History:  Procedure Laterality Date   ABDOMINAL HYSTERECTOMY     bleeding, fibroids   BREAST SURGERY  2015   Reduction   BUNIONECTOMY     CERVICAL CONE BIOPSY  2006   COLONOSCOPY  08/17/2011   Procedure: COLONOSCOPY;  Surgeon: Corbin Ade, MD;  [facility];  Service: Endoscopy;  Laterality: N/A;  7:30   COLONOSCOPY N/A 06/11/2019   Procedure: COLONOSCOPY;  Surgeon: Corbin Ade, MD;  [facility];  Service: Endoscopy;  Laterality: N/A;  10:30   REDUCTION MAMMAPLASTY Bilateral     Prior to Admission medications   Medication Sig Start Date End Date Taking? Authorizing Provider  acetaminophen (TYLENOL) 500 MG tablet Take 500-1,000 mg by mouth every 6 (six) hours as needed (PAIN.).   Yes [provider]  Biotin 5000 MCG CAPS    Yes [provider]  Cholecalciferol (VITAMIN D3) 50 MCG (2000 UT) TABS Take 2,000 Units by mouth daily after lunch.   Yes [provider]  clindamycin (CLEOCIN T) 1 % lotion Apply 1 application topically daily.    Yes [provider]  Collagenase POWD 1 Scoop by Does not apply route daily.   Yes [provider]  estradiol (ESTRACE VAGINAL) 0.1 MG/GM vaginal cream Insert 1/2 gram vaginally at bedtime for 2 weeks. After 2 weeks insert 1/2 gram vaginally at bedtime nightly twice a week. 02/14/22  Yes Amundson Shirley Friar, MD  fluticasone (FLONASE) 50 MCG/ACT nasal spray Place 1 spray into both nostrils 2 (two) times daily. 12/18/21  Yes Particia Nearing, PA-C  hydroxypropyl methylcellulose (ISOPTO TEARS) 2.5 % ophthalmic solution Place 1 drop into both eyes 4 (four) times daily as needed (dry/irritated eyes.).    Yes [provider]  Misc Natural Products (OSTEO BI-FLEX ADV JOINT SHIELD PO)    Yes [provider]  Multiple Vitamin (MULTIVITAMIN WITH MINERALS) TABS tablet Take 1 tablet by mouth daily after lunch.   Yes [provider]  OVER THE COUNTER MEDICATION 1 Scoop by Does not apply route once a week. Bone broth protein powder   Yes [provider]  Probiotic Product (PROBIOTIC DAILY PO) Take by mouth.   Yes [provider]  Zinc 50 MG TABS    Yes [provider]    Allergies as of 10/17/2022 - Review Complete 10/17/2022  Allergen Reaction Noted   Shellfish allergy Other (See Comments) 08/11/2011   Ciprofloxacin Rash 08/01/2011   Macrobid [nitrofurantoin monohydrate macrocrystals] Rash 03/24/2011   Neomycin Rash 08/01/2011    Family History  Problem Relation Age of Onset   Stomach cancer Mother    Hypertension Father    Heart disease Father    Prostate cancer Father    Breast cancer Paternal Aunt        Age 4   Prostate cancer Brother    Colon  cancer Paternal Aunt     Social History   Socioeconomic History   Marital status: Married    Spouse name: Not on file   Number of children: Not on file   Years of education: Not on file   Highest education level: Not on file  Occupational History   Occupation: 3rd grade teacher    Employer: [employer]  Tobacco Use   Smoking status: Former   Smokeless tobacco: Never  Advertising account planner   Vaping status: Never Used  Substance and Sexual Activity   Alcohol use: Not Currently    Comment: occasional--2 drinks/month   Drug use: No   Sexual activity: Yes    Birth control/protection: Surgical    Comment: HYST-1st intercourse 67 yo-Fewer than 5 partners  Other Topics Concern   Not on file  Social History Narrative   Not on file   Social Determinants of Health   Financial Resource Strain: Not on file  Food Insecurity: Not on file  Transportation Needs: Not on file  Physical Activity: Not on file  Stress: Not on file  Social Connections: Not on file  Intimate Partner Violence: Not on file    Review of Systems: See HPI, otherwise negative ROS  Physical Exam: BP 126/79 (BP Location: Left Arm, Patient Position: Sitting, Cuff Size: Large)   Pulse 72   Temp (!) 97.2 F (36.2 C) (Oral)   Ht 5\' 6"  (1.676 m)   Wt 190 lb 9.6 oz (86.5 kg)   LMP 03/04/2015   SpO2 99%   BMI 30.76 kg/m  General:   Alert,  Well-developed, well-nourished, pleasant and cooperative in NAD Abdomen: Non-distended, normal bowel sounds.  Soft and nontender without appreciable mass or hepatosplenomegaly.   Impression/Plan: Pleasant 67 year old lady with a several day history of abdominal cramps which resolved.  No other GI symptoms.  Robotic prescribed since the onset of her symptoms.  She is now asymptomatic.  I suspect she may have consumed something that did not agree with her.  May have had a self-limited enteric infection.  Has some perceived lactose intolerance since this episode.  She is  up-to-date on colon cancer screening.   As discussed, you had self-limiting GI symptoms.  Nonspecific.  May have consumed some bad food which caused a transient gut infection.  I am pleased to see that your symptoms have resolved.  1 more month of daily align is recommended.  I suspect you will be able to add dairy back to your diet as time goes on.  If you are able to get more information about your mothers stomach cancer please let me know.  Plan for colonoscopy for screening 2031.  If you have any recurrent issues please do not hesitate to give me a call Notice: This dictation was prepared with Dragon dictation along with smaller phrase technology. Any  transcriptional errors that result from this process are unintentional and may not be corrected upon review.

## 2022-10-21 DIAGNOSIS — Z23 Encounter for immunization: Secondary | ICD-10-CM | POA: Diagnosis not present

## 2022-10-24 ENCOUNTER — Ambulatory Visit: Payer: Medicare PPO | Admitting: Internal Medicine

## 2022-12-25 ENCOUNTER — Other Ambulatory Visit: Payer: Self-pay | Admitting: Family Medicine

## 2023-01-18 ENCOUNTER — Other Ambulatory Visit: Payer: Self-pay | Admitting: Internal Medicine

## 2023-01-18 DIAGNOSIS — Z1231 Encounter for screening mammogram for malignant neoplasm of breast: Secondary | ICD-10-CM

## 2023-04-09 DIAGNOSIS — T162XXA Foreign body in left ear, initial encounter: Secondary | ICD-10-CM | POA: Diagnosis not present

## 2023-05-15 ENCOUNTER — Other Ambulatory Visit (HOSPITAL_COMMUNITY)
Admission: RE | Admit: 2023-05-15 | Discharge: 2023-05-15 | Disposition: A | Discharge status: Home / Self Care | Source: Ambulatory Visit | Attending: Obstetrics and Gynecology | Admitting: Obstetrics and Gynecology

## 2023-05-15 ENCOUNTER — Ambulatory Visit (INDEPENDENT_AMBULATORY_CARE_PROVIDER_SITE_OTHER): Payer: Medicare PPO | Admitting: Obstetrics and Gynecology

## 2023-05-15 ENCOUNTER — Encounter: Payer: Self-pay | Admitting: Obstetrics and Gynecology

## 2023-05-15 VITALS — BP 124/78 | HR 58 | Ht 65.0 in | Wt 187.0 lb

## 2023-05-15 DIAGNOSIS — Z1272 Encounter for screening for malignant neoplasm of vagina: Secondary | ICD-10-CM | POA: Insufficient documentation

## 2023-05-15 DIAGNOSIS — Z9189 Other specified personal risk factors, not elsewhere classified: Secondary | ICD-10-CM | POA: Diagnosis not present

## 2023-05-15 DIAGNOSIS — Z5181 Encounter for therapeutic drug level monitoring: Secondary | ICD-10-CM

## 2023-05-15 DIAGNOSIS — Z1151 Encounter for screening for human papillomavirus (HPV): Secondary | ICD-10-CM | POA: Diagnosis not present

## 2023-05-15 DIAGNOSIS — Z01419 Encounter for gynecological examination (general) (routine) without abnormal findings: Secondary | ICD-10-CM

## 2023-05-15 DIAGNOSIS — Z8742 Personal history of other diseases of the female genital tract: Secondary | ICD-10-CM | POA: Diagnosis not present

## 2023-05-15 DIAGNOSIS — Z9289 Personal history of other medical treatment: Secondary | ICD-10-CM

## 2023-05-15 MED ORDER — ESTRADIOL 0.1 MG/GM VA CREA: TOPICAL_CREAM | VAGINAL | 2 refills | Status: AC

## 2023-05-15 NOTE — Progress Notes (Deleted)
 68 y.o. G1P0010 Married [place] American female here for annual exam.    PCP: Margarete Sharps, MD   Patient's last menstrual period was 03/04/2015.           Sexually active: Yes.    The current method of family planning is status post hysterectomy.    Menopausal hormone therapy:  Estrace  Exercising: {yes no:314532}  {types:19826} Smoker:  no  OB History  Gravida Para Term Preterm AB Living  1    1 0  SAB IAB Ectopic Multiple Live Births          # Outcome Date GA Lbr Len/2nd Weight Sex Type Anes PTL Lv  1 AB              HEALTH MAINTENANCE: Last 2 paps:  02/14/22 neg HR HPV neg, 01/06/21 ASCUS HR HPV neg  History of abnormal Pap or positive HPV:  yes Mammogram:   05/30/22 Breast Density Cat B, BIRADS Cat 1 neg  Colonoscopy:  06/11/19  Bone Density:  08/07/22  Result  Normal    Immunization History  Administered Date(s) Administered   Moderna Covid-19 Fall Seasonal Vaccine 52yrs & older 02/18/2022   Moderna Sars-Covid-2 Vaccination 03/12/2019, 04/12/2019      reports that she has quit smoking. She has never used smokeless tobacco. She reports that she does not currently use alcohol. She reports that she does not use drugs.  Past Medical History:  Diagnosis Date   Anxiety    Arthritis    ASCUS of cervix with negative high risk HPV 09/2018   CIN III (cervical intraepithelial neoplasia III) 2007   Hysterectomy    Past Surgical History:  Procedure Laterality Date   ABDOMINAL HYSTERECTOMY     bleeding, fibroids   BREAST SURGERY  2015   Reduction   BUNIONECTOMY     CERVICAL CONE BIOPSY  2006   COLONOSCOPY  08/17/2011   Procedure: COLONOSCOPY;  Surgeon: Suzette Espy, MD;  [facility];  Service: Endoscopy;  Laterality: N/A;  7:30   COLONOSCOPY N/A 06/11/2019   Procedure: COLONOSCOPY;  Surgeon: Suzette Espy, MD;  [facility];  Service: Endoscopy;  Laterality: N/A;  10:30   REDUCTION MAMMAPLASTY Bilateral     Current Outpatient Medications   Medication Sig Dispense Refill   acetaminophen (TYLENOL) 500 MG tablet Take 500-1,000 mg by mouth every 6 (six) hours as needed (PAIN.).     Biotin 5000 MCG CAPS      Cholecalciferol (VITAMIN D3) 50 MCG (2000 UT) TABS Take 2,000 Units by mouth daily after lunch.     clindamycin (CLEOCIN T) 1 % lotion Apply 1 application topically daily.      Collagenase POWD 1 Scoop by Does not apply route daily.     estradiol  (ESTRACE  VAGINAL) 0.1 MG/GM vaginal cream Insert 1/2 gram vaginally at bedtime for 2 weeks. After 2 weeks insert 1/2 gram vaginally at bedtime nightly twice a week. 42.5 g 2   fluticasone  (FLONASE ) 50 MCG/ACT nasal spray Place 1 spray into both nostrils 2 (two) times daily. 16 g 2   hydroxypropyl methylcellulose (ISOPTO TEARS) 2.5 % ophthalmic solution Place 1 drop into both eyes 4 (four) times daily as needed (dry/irritated eyes.).      Misc Natural Products (OSTEO BI-FLEX ADV JOINT SHIELD PO)      Multiple Vitamin (MULTIVITAMIN WITH MINERALS) TABS tablet Take 1 tablet by mouth daily after lunch.     OVER THE COUNTER MEDICATION 1 Scoop by Does  not apply route once a week. Bone broth protein powder     Probiotic Product (PROBIOTIC DAILY PO) Take by mouth.     Zinc 50 MG TABS      No current facility-administered medications for this visit.    ALLERGIES: Shellfish allergy, Ciprofloxacin , Macrobid  [nitrofurantoin  monohydrate macrocrystals], and Neomycin  Family History  Problem Relation Age of Onset   Stomach cancer Mother    Hypertension Father    Heart disease Father    Prostate cancer Father    Breast cancer Paternal Aunt        Age 80   Prostate cancer Brother    Colon cancer Paternal Aunt     Review of Systems  PHYSICAL EXAM:  LMP 03/04/2015     General appearance: alert, cooperative and appears stated age Head: normocephalic, without obvious abnormality, atraumatic Neck: no adenopathy, supple, symmetrical, trachea midline and thyroid normal to inspection and  palpation Lungs: clear to auscultation bilaterally Breasts: normal appearance, no masses or tenderness, No nipple retraction or dimpling, No nipple discharge or bleeding, No axillary adenopathy Heart: regular rate and rhythm Abdomen: soft, non-tender; no masses, no organomegaly Extremities: extremities normal, atraumatic, no cyanosis or edema Skin: skin color, texture, turgor normal. No rashes or lesions Lymph nodes: cervical, supraclavicular, and axillary nodes normal. Neurologic: grossly normal  Pelvic: External genitalia:  no lesions              No abnormal inguinal nodes palpated.              Urethra:  normal appearing urethra with no masses, tenderness or lesions              Bartholins and Skenes: normal                 Vagina: normal appearing vagina with normal color and discharge, no lesions              Cervix: no lesions              Pap taken: {yes no:314532} Bimanual Exam:  Uterus:  normal size, contour, position, consistency, mobility, non-tender              Adnexa: no mass, fullness, tenderness              Rectal exam: {yes no:314532}.  Confirms.              Anus:  normal sphincter tone, no lesions  Chaperone was present for exam:  {BSCHAPERONE:31226::"Emily F, CMA"}  ASSESSMENT: Well woman visit with gynecologic exam.  PHQ-9: ***  ***  PLAN: Mammogram screening discussed. Self breast awareness reviewed. Pap and HRV collected:  {yes no:314532} Guidelines for Calcium, Vitamin D , regular exercise program including cardiovascular and weight bearing exercise. Medication refills:  *** {LABS (Optional):23779} Follow up:  ***    Additional counseling given.  {yes [id number]. ***  total time was spent for this patient encounter, including preparation, face-to-face counseling with the patient, coordination of care, and documentation of the encounter in addition to doing the well woman visit with gynecologic exam.

## 2023-05-15 NOTE — Patient Instructions (Signed)

## 2023-05-15 NOTE — Progress Notes (Signed)
 68 y.o. G41P0010 Married [place] American female here for a breast and pelvic exam.    The patient is also followed for recurrent ASCUS paps and vaginal atrophy.  She is using vaginal estrogen for atrophy, and it gives her comfort.   PCP: Margarete Sharps, MD   Patient's last menstrual period was 03/04/2015.           Sexually active: Yes.    The current method of family planning is status post hysterectomy.    Menopausal hormone therapy:   cream Exercising: yes.  Goes to the [employer]. Smoker:  no  OB History     Gravida  1   Para      Term      Preterm      AB  1   Living  0      SAB      IAB      Ectopic      Multiple      Live Births              HEALTH MAINTENANCE: Last 2 paps: 02/14/22 neg HR HPV neg, 01/06/21 ASCUS HR HPV neg  History of abnormal Pap or positive HPV:  yes Mammogram:  05/30/22 Breast Density Cat B, BIRADS Cat 1 neg.  Has appt.   Colonoscopy:  06/11/19 - due in 2031 Bone Density:  08/07/22  Result  normal    Immunization History  Administered Date(s) Administered   Moderna Covid-19 Fall Seasonal Vaccine 19yrs & older 02/18/2022   Moderna Sars-Covid-2 Vaccination 03/12/2019, 04/12/2019      reports that she has quit smoking. She has never used smokeless tobacco. She reports current alcohol use. She reports that she does not use drugs.  Past Medical History:  Diagnosis Date   Anxiety    Arthritis    ASCUS of cervix with negative high risk HPV 09/2018   CIN III (cervical intraepithelial neoplasia III) 2007   Hysterectomy    Past Surgical History:  Procedure Laterality Date   ABDOMINAL HYSTERECTOMY     bleeding, fibroids   BREAST SURGERY  2015   Reduction   BUNIONECTOMY     CERVICAL CONE BIOPSY  2006   COLONOSCOPY  08/17/2011   Procedure: COLONOSCOPY;  Surgeon: Suzette Espy, MD;  Location: [facility];  Service: Endoscopy;  Laterality: N/A;  7:30   COLONOSCOPY N/A 06/11/2019   Procedure: COLONOSCOPY;  Surgeon: Suzette Espy, MD;  Location: [facility];  Service: Endoscopy;  Laterality: N/A;  10:30   REDUCTION MAMMAPLASTY Bilateral     Current Outpatient Medications  Medication Sig Dispense Refill   acetaminophen (TYLENOL) 500 MG tablet Take 500-1,000 mg by mouth every 6 (six) hours as needed (PAIN.).     Biotin 5000 MCG CAPS      Cholecalciferol (VITAMIN D3) 50 MCG (2000 UT) TABS Take 2,000 Units by mouth daily after lunch.     clindamycin (CLEOCIN T) 1 % lotion Apply 1 application topically daily.      Collagenase POWD 1 Scoop by Does not apply route daily.     estradiol  (ESTRACE  VAGINAL) 0.1 MG/GM vaginal cream Insert 1/2 gram vaginally at bedtime for 2 weeks. After 2 weeks insert 1/2 gram vaginally at bedtime nightly twice a week. 42.5 g 2   fluticasone  (FLONASE ) 50 MCG/ACT nasal spray Place 1 spray into both nostrils 2 (two) times daily. 16 g 2   hydroxypropyl methylcellulose (ISOPTO TEARS) 2.5 % ophthalmic solution Place 1 drop into  both eyes 4 (four) times daily as needed (dry/irritated eyes.).      Misc Natural Products (OSTEO BI-FLEX ADV JOINT SHIELD PO)      Multiple Vitamin (MULTIVITAMIN WITH MINERALS) TABS tablet Take 1 tablet by mouth daily after lunch.     OVER THE COUNTER MEDICATION 1 Scoop by Does not apply route once a week. Bone broth protein powder     Probiotic Product (PROBIOTIC DAILY PO) Take by mouth.     Zinc 50 MG TABS      No current facility-administered medications for this visit.    ALLERGIES: Shellfish allergy, Ciprofloxacin , Macrobid  [nitrofurantoin  monohydrate macrocrystals], and Neomycin  Family History  Problem Relation Age of Onset   Stomach cancer Mother    Hypertension Father    Heart disease Father    Prostate cancer Father    Breast cancer Paternal Aunt        Age 34   Prostate cancer Brother    Colon cancer Paternal Aunt     Review of Systems  All other systems reviewed and are negative.   PHYSICAL EXAM:  BP 124/78 (BP Location: Left Arm,  Patient Position: Sitting)   Pulse (!) 58   Ht 5\' 5"  (1.651 m)   Wt 187 lb (84.8 kg)   LMP 03/04/2015   SpO2 98%   BMI 31.12 kg/m     General appearance: alert, cooperative and appears stated age Head: normocephalic, without obvious abnormality, atraumatic Neck: no adenopathy, supple, symmetrical, trachea midline and thyroid normal to inspection and palpation Lungs: clear to auscultation bilaterally Breasts: consistent with reduction, no masses or tenderness, No nipple retraction or dimpling, No nipple discharge or bleeding, No axillary adenopathy Heart: regular rate and rhythm Abdomen: soft, non-tender; no masses, no organomegaly Extremities: extremities normal, atraumatic, no cyanosis or edema Skin: skin color, texture, turgor normal. No rashes or lesions Lymph nodes: cervical, supraclavicular, and axillary nodes normal. Neurologic: grossly normal  Pelvic: External genitalia:  no lesions              No abnormal inguinal nodes palpated.              Urethra:  normal appearing urethra with no masses, tenderness or lesions              Bartholins and Skenes: normal                 Vagina: normal appearing vagina with normal color and discharge, no lesions              Cervix:  absent              Pap taken: yes Bimanual Exam:  Uterus:  absent              Adnexa: no mass, fullness, tenderness              Rectal exam: declined.   Chaperone was present for exam:  Cottie Diss, CMA  ASSESSMENT: Encounter for breast and pelvic exam.  Personal hx of other medical tx.  Status post TVH for CIN III. Hx recurrent ASCUS paps.  Screening for vaginal cancer.  Vaginal atrophy. Encounter for medication monitoring.  Hx breast reduction.   PLAN: Mammogram screening discussed. Self breast awareness reviewed. Pap and HRV collected:  yes Guidelines for Calcium, Vitamin D , regular exercise program including cardiovascular and weight bearing exercise. Medication refills:  vaginal estradiol   cream.  I discussed potential effect on breast cancer.  Labs with PCP Follow up:  1 year.   Additional counseling given.  yes. 20 min  total time was spent for this patient encounter, including preparation, face-to-face counseling with the patient, coordination of care, and documentation of the encounter in addition to doing the breast and pelvic exam and pap.

## 2023-05-17 ENCOUNTER — Ambulatory Visit: Payer: Self-pay | Admitting: Obstetrics and Gynecology

## 2023-05-17 LAB — CYTOLOGY - PAP
Comment: NEGATIVE
Diagnosis: NEGATIVE
High risk HPV: NEGATIVE

## 2023-05-23 ENCOUNTER — Encounter: Payer: Self-pay | Admitting: Obstetrics and Gynecology

## 2023-05-31 ENCOUNTER — Ambulatory Visit
Admission: RE | Admit: 2023-05-31 | Discharge: 2023-05-31 | Disposition: A | Discharge status: Home / Self Care | Payer: Medicare PPO | Source: Ambulatory Visit | Attending: Internal Medicine | Admitting: Internal Medicine

## 2023-05-31 DIAGNOSIS — Z1231 Encounter for screening mammogram for malignant neoplasm of breast: Secondary | ICD-10-CM | POA: Diagnosis not present

## 2023-06-06 ENCOUNTER — Ambulatory Visit: Payer: Self-pay | Admitting: Obstetrics and Gynecology

## 2023-06-07 DIAGNOSIS — R739 Hyperglycemia, unspecified: Secondary | ICD-10-CM | POA: Diagnosis not present

## 2023-06-07 DIAGNOSIS — Z1212 Encounter for screening for malignant neoplasm of rectum: Secondary | ICD-10-CM | POA: Diagnosis not present

## 2023-06-07 DIAGNOSIS — E559 Vitamin D deficiency, unspecified: Secondary | ICD-10-CM | POA: Diagnosis not present

## 2023-06-07 DIAGNOSIS — E785 Hyperlipidemia, unspecified: Secondary | ICD-10-CM | POA: Diagnosis not present

## 2023-06-08 DIAGNOSIS — Z6829 Body mass index (BMI) 29.0-29.9, adult: Secondary | ICD-10-CM | POA: Diagnosis not present

## 2023-06-08 DIAGNOSIS — E663 Overweight: Secondary | ICD-10-CM | POA: Diagnosis not present

## 2023-06-08 DIAGNOSIS — H9209 Otalgia, unspecified ear: Secondary | ICD-10-CM | POA: Diagnosis not present

## 2023-06-13 DIAGNOSIS — Z1212 Encounter for screening for malignant neoplasm of rectum: Secondary | ICD-10-CM | POA: Diagnosis not present

## 2023-06-13 DIAGNOSIS — E559 Vitamin D deficiency, unspecified: Secondary | ICD-10-CM | POA: Diagnosis not present

## 2023-06-13 DIAGNOSIS — E785 Hyperlipidemia, unspecified: Secondary | ICD-10-CM | POA: Diagnosis not present

## 2023-06-14 DIAGNOSIS — E559 Vitamin D deficiency, unspecified: Secondary | ICD-10-CM | POA: Diagnosis not present

## 2023-06-14 DIAGNOSIS — R928 Other abnormal and inconclusive findings on diagnostic imaging of breast: Secondary | ICD-10-CM | POA: Diagnosis not present

## 2023-06-14 DIAGNOSIS — G5600 Carpal tunnel syndrome, unspecified upper limb: Secondary | ICD-10-CM | POA: Diagnosis not present

## 2023-06-14 DIAGNOSIS — N879 Dysplasia of cervix uteri, unspecified: Secondary | ICD-10-CM | POA: Diagnosis not present

## 2023-06-14 DIAGNOSIS — N952 Postmenopausal atrophic vaginitis: Secondary | ICD-10-CM | POA: Diagnosis not present

## 2023-06-14 DIAGNOSIS — E785 Hyperlipidemia, unspecified: Secondary | ICD-10-CM | POA: Diagnosis not present

## 2023-06-14 DIAGNOSIS — R82998 Other abnormal findings in urine: Secondary | ICD-10-CM | POA: Diagnosis not present

## 2023-06-14 DIAGNOSIS — Z0000 Encounter for general adult medical examination without abnormal findings: Secondary | ICD-10-CM | POA: Diagnosis not present

## 2023-06-14 DIAGNOSIS — E669 Obesity, unspecified: Secondary | ICD-10-CM | POA: Diagnosis not present

## 2023-10-09 DIAGNOSIS — L810 Postinflammatory hyperpigmentation: Secondary | ICD-10-CM | POA: Diagnosis not present

## 2023-10-09 DIAGNOSIS — L738 Other specified follicular disorders: Secondary | ICD-10-CM | POA: Diagnosis not present

## 2023-10-20 DIAGNOSIS — Z23 Encounter for immunization: Secondary | ICD-10-CM | POA: Diagnosis not present

## 2023-11-05 DIAGNOSIS — M25561 Pain in right knee: Secondary | ICD-10-CM | POA: Diagnosis not present

## 2023-11-05 DIAGNOSIS — M1711 Unilateral primary osteoarthritis, right knee: Secondary | ICD-10-CM | POA: Diagnosis not present

## 2024-05-27 ENCOUNTER — Encounter: Admitting: Obstetrics and Gynecology

## 2024-06-03 ENCOUNTER — Encounter: Admitting: Obstetrics and Gynecology
